# Patient Record
Sex: Female | Born: 1970 | Race: Black or African American | Hispanic: No | Marital: Married | Smoking: Never smoker
Health system: Southern US, Community
[De-identification: ages and names within clinical notes are randomized; demographics above are authoritative.]

## PROBLEM LIST (undated history)

## (undated) DIAGNOSIS — Z973 Presence of spectacles and contact lenses: Secondary | ICD-10-CM

## (undated) DIAGNOSIS — N6310 Unspecified lump in the right breast, unspecified quadrant: Secondary | ICD-10-CM

## (undated) DIAGNOSIS — Z889 Allergy status to unspecified drugs, medicaments and biological substances status: Secondary | ICD-10-CM

## (undated) HISTORY — PX: COSMETIC SURGERY: SHX468

## (undated) HISTORY — PX: INTRAUTERINE DEVICE INSERTION: SHX323

---

## 2005-09-25 ENCOUNTER — Emergency Department (HOSPITAL_COMMUNITY): Admission: EM | Admit: 2005-09-25 | Discharge: 2005-09-26 | Payer: Self-pay | Admitting: Emergency Medicine

## 2007-11-25 ENCOUNTER — Encounter: Admission: RE | Admit: 2007-11-25 | Discharge: 2007-11-25 | Payer: Self-pay | Admitting: Obstetrics and Gynecology

## 2007-11-25 IMAGING — MG MM DIAGNOSTIC BILATERAL
4 series · 4 of 4 positions shown · non-contrast
Comparison: None

CLINICAL DATA: The patient is experiencing tenderness across the
upper aspect of the right breast.  Upon compression, she notices a
milky discharge.

DIGITAL DIAGNOSTIC BILATERAL MAMMOGRAM WITH CAD

[R CC]
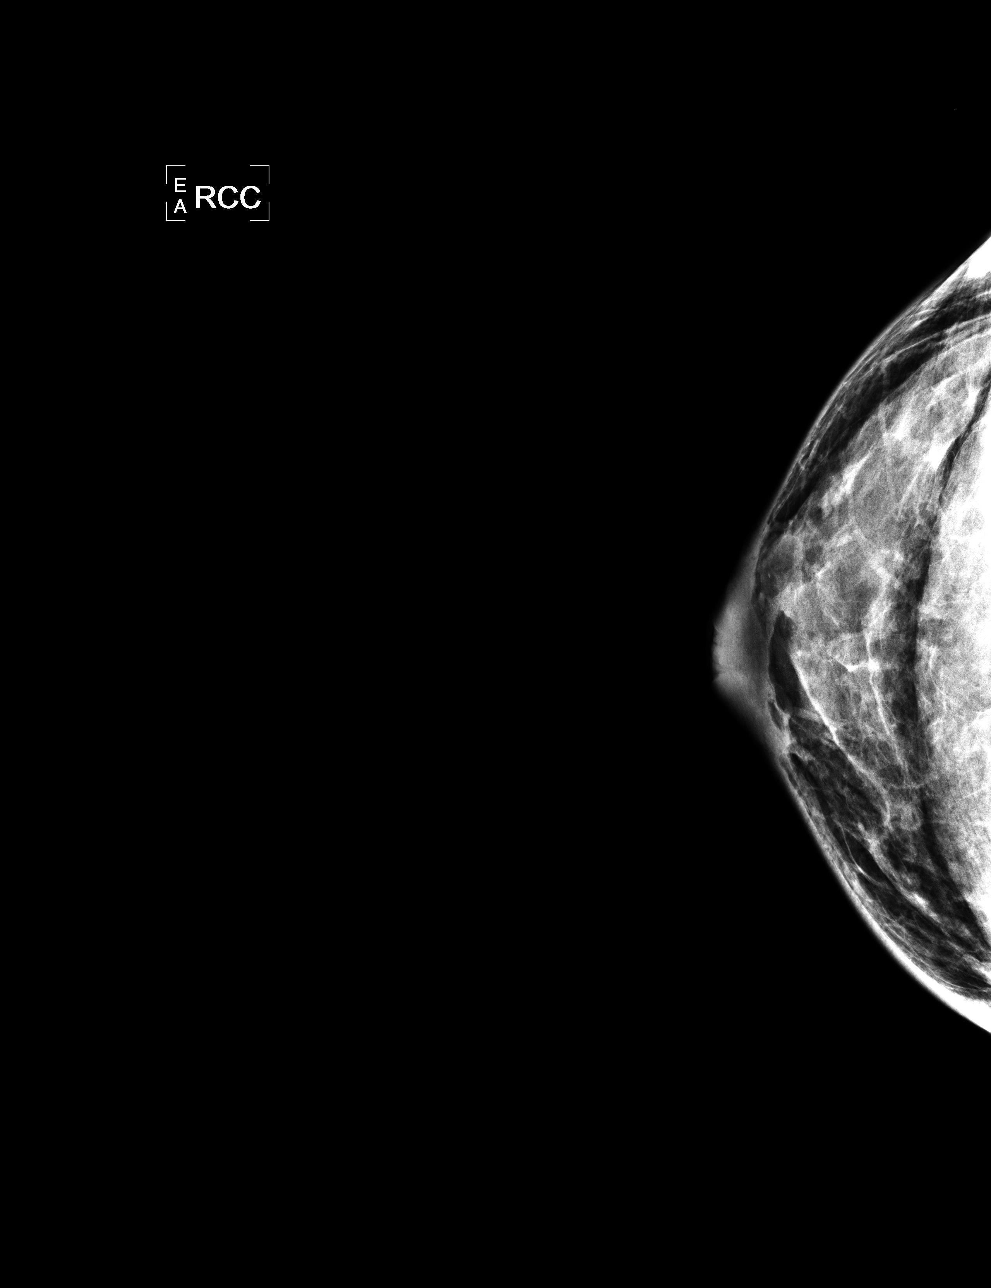

[L CC]
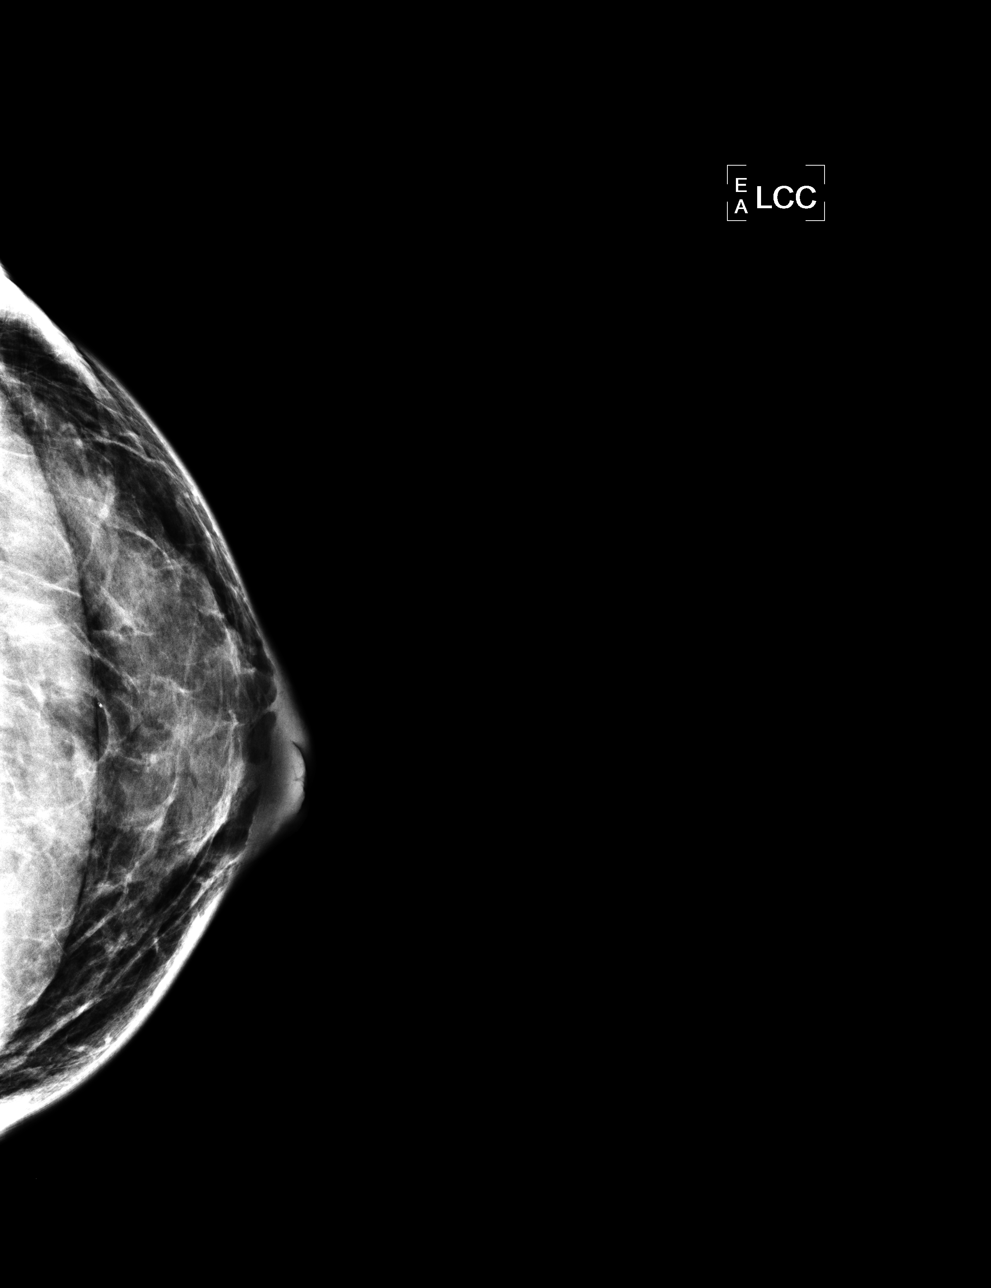

[L MLO]
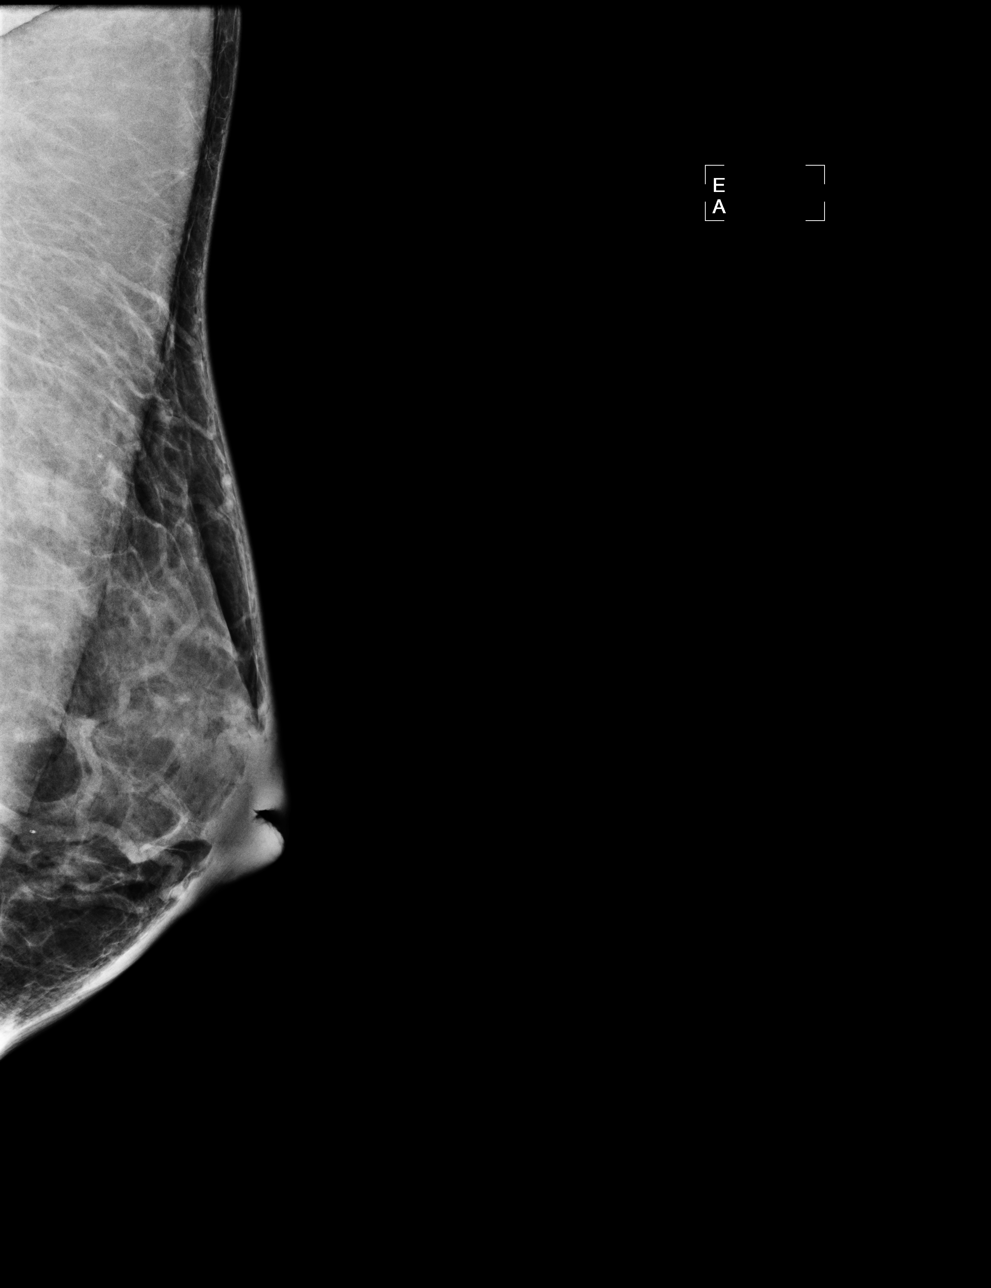

[R MLO]
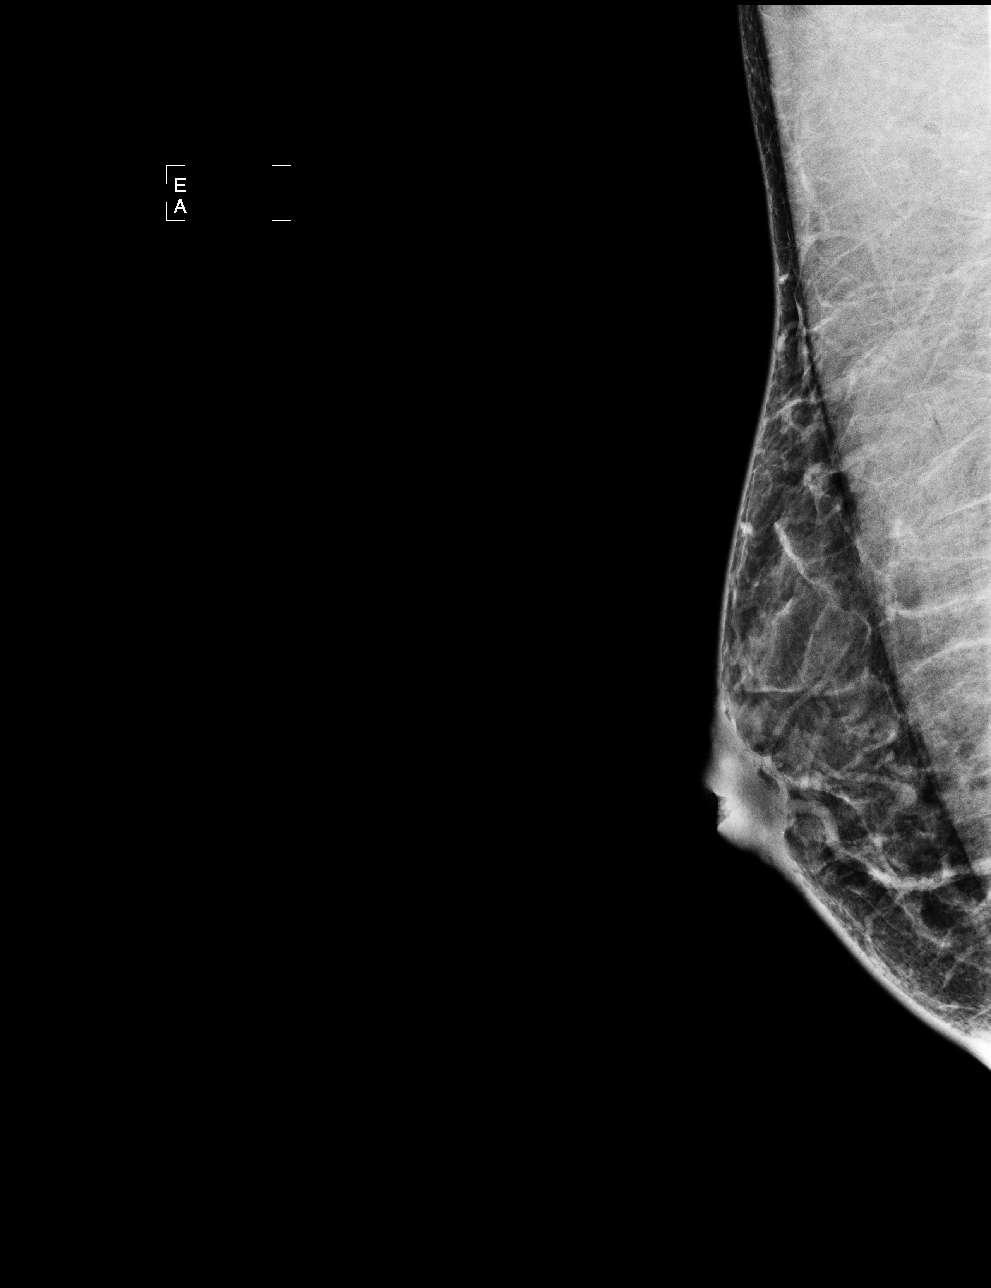

[4 of 4 positions shown; findings below may reference images not displayed]

FINDINGS: The breast tissue is heterogeneously dense.  There is no
dominant mass, architectural distortion or calcification to suggest
malignancy.  A milky discharge is not the type of discharge of
concern for carcinoma.  One may wish to consider prolactin levels.
IMPRESSION: No mammographic evidence of malignancy.  Yearly screening
mammography should begin at age 40 unless clinically indicated
earlier.

BI-RADS CATEGORY 1:  Negative.

## 2010-06-09 ENCOUNTER — Other Ambulatory Visit: Payer: Self-pay | Admitting: Obstetrics and Gynecology

## 2010-06-09 DIAGNOSIS — Z1231 Encounter for screening mammogram for malignant neoplasm of breast: Secondary | ICD-10-CM

## 2010-08-01 ENCOUNTER — Ambulatory Visit
Admission: RE | Admit: 2010-08-01 | Discharge: 2010-08-01 | Disposition: A | Discharge status: Home / Self Care | Payer: Managed Care, Other (non HMO) | Source: Ambulatory Visit | Attending: Obstetrics and Gynecology | Admitting: Obstetrics and Gynecology

## 2010-08-01 DIAGNOSIS — Z1231 Encounter for screening mammogram for malignant neoplasm of breast: Secondary | ICD-10-CM

## 2010-08-01 IMAGING — MG MM DIGITAL SCREENING {BCG}
4 series · 4 of 4 positions shown · non-contrast
Comparison: none

[R CC]
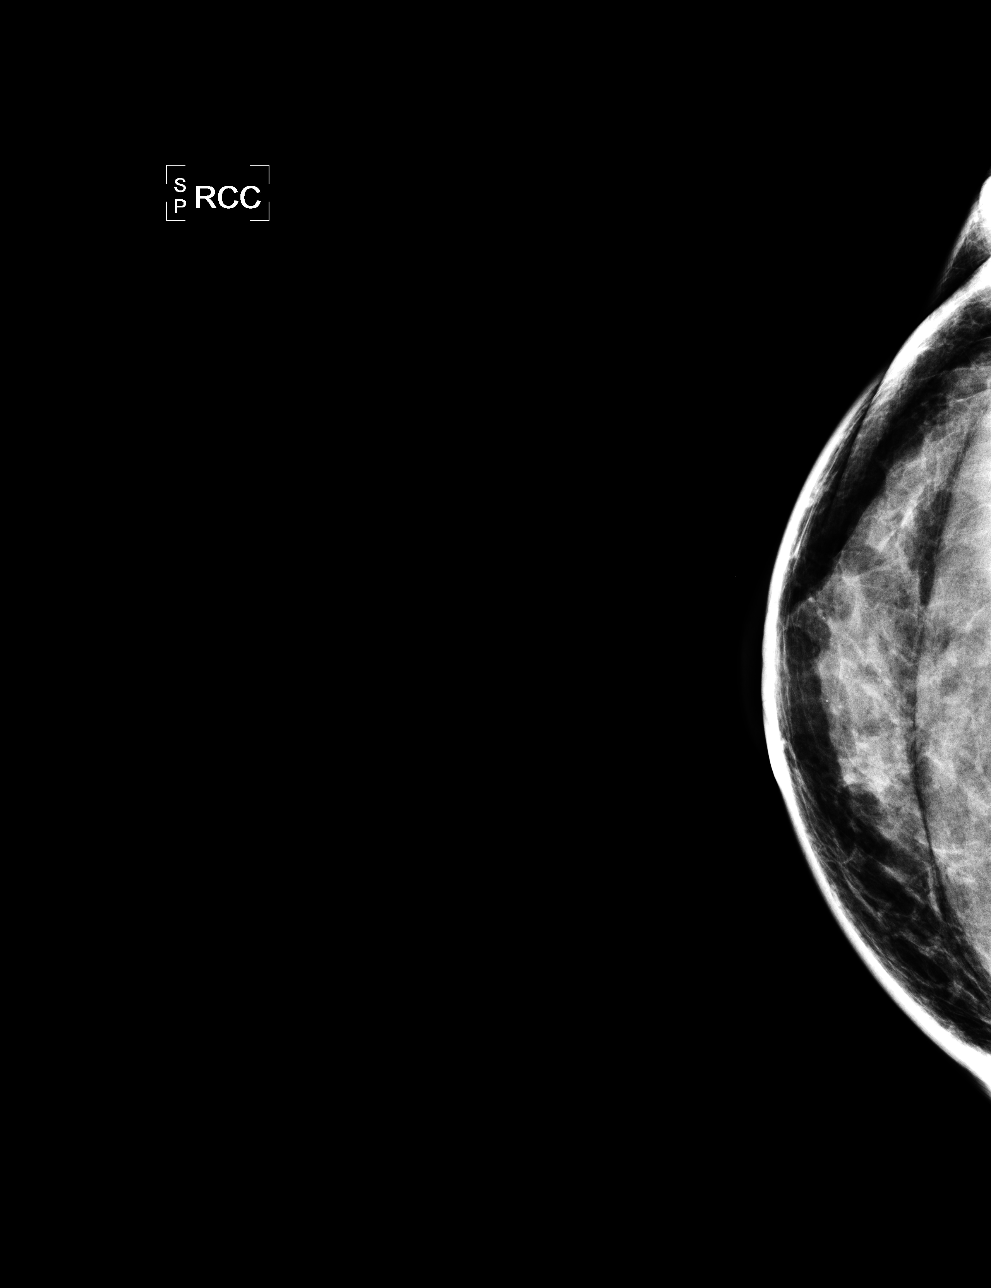

[L CC]
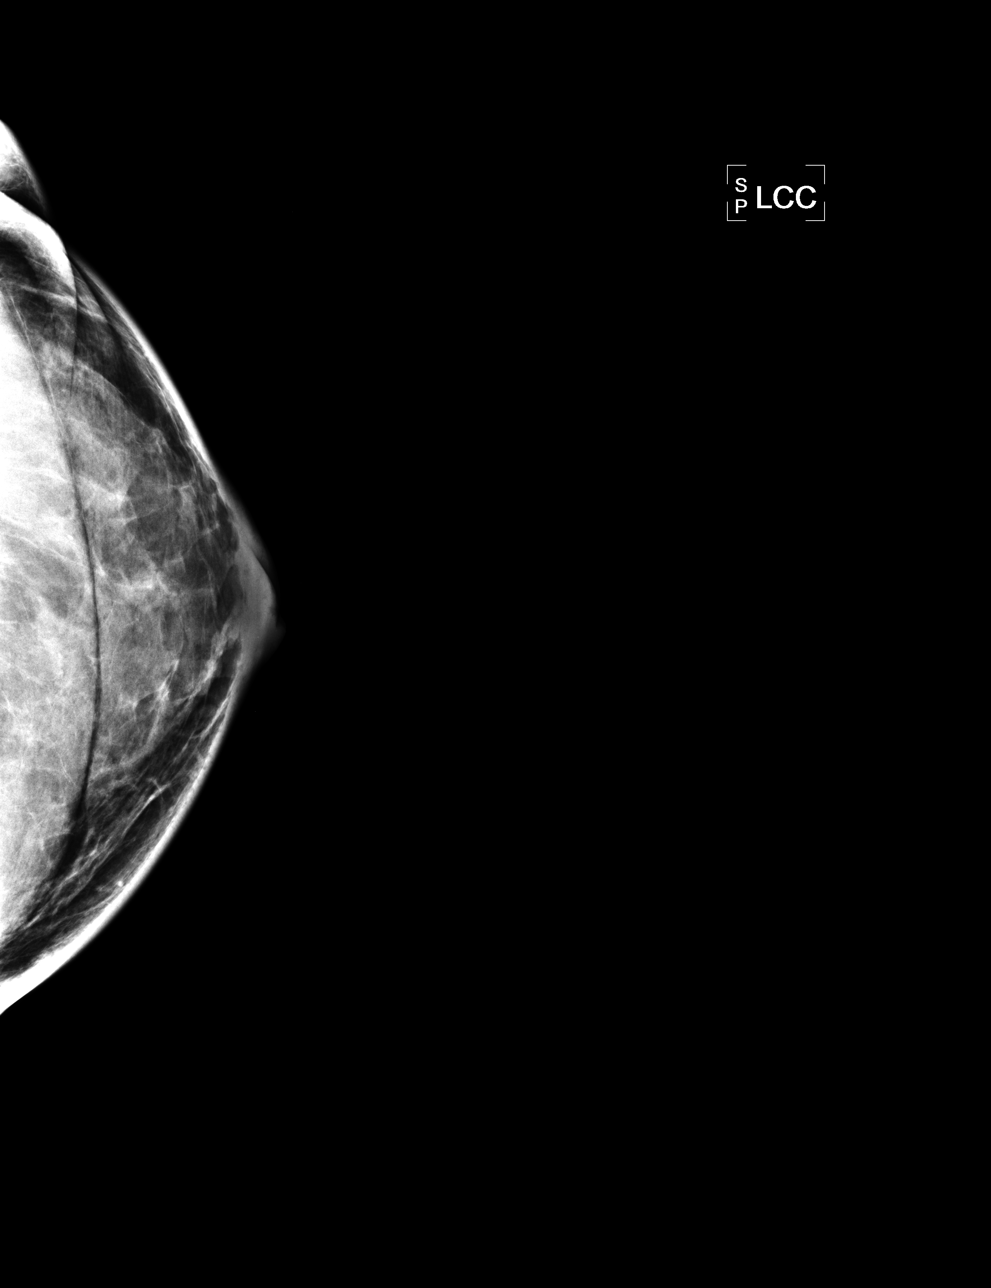

[L MLO]
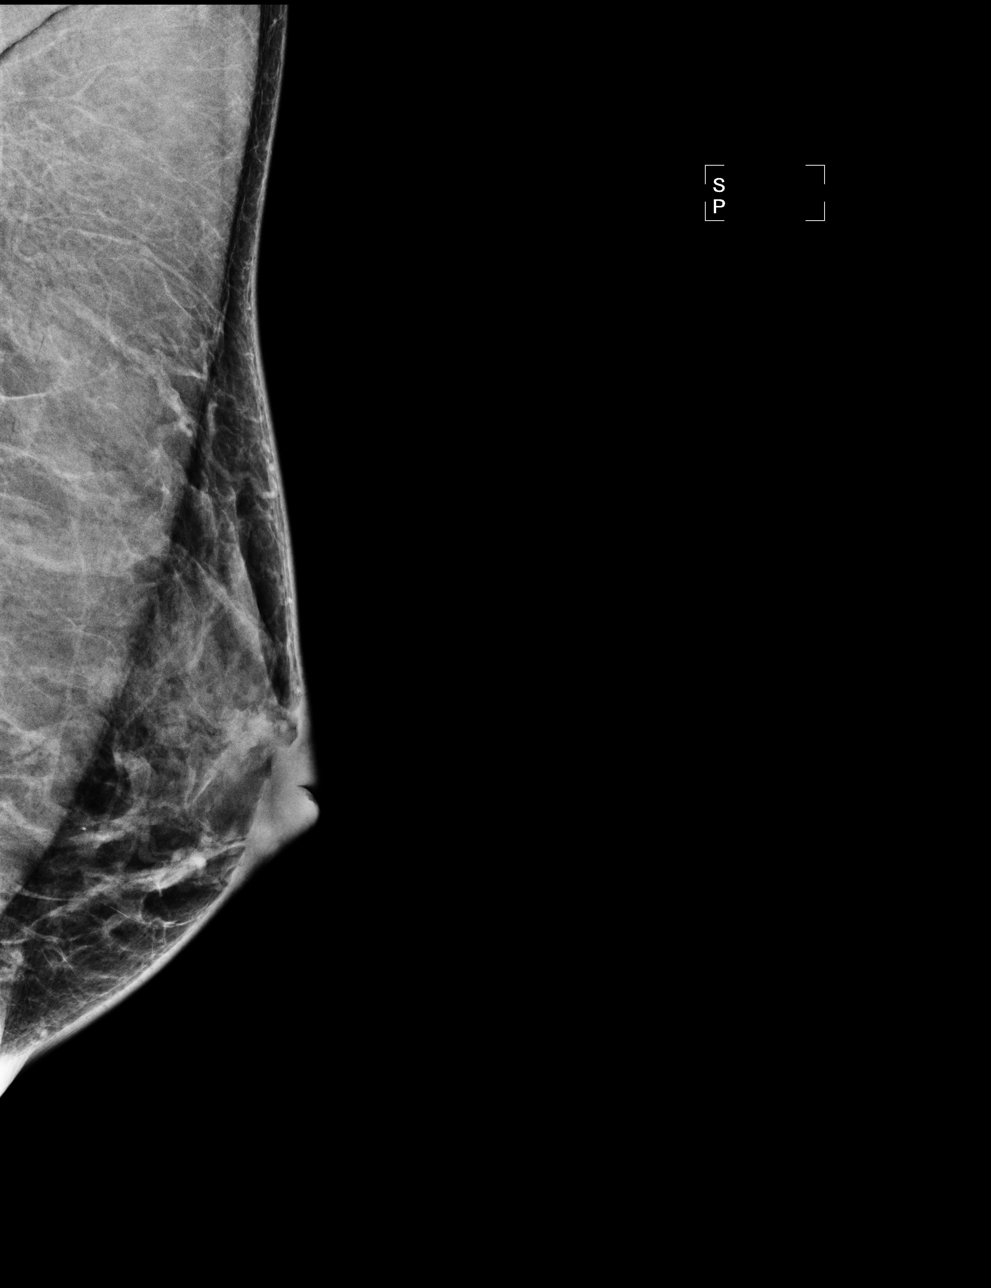

[R MLO]
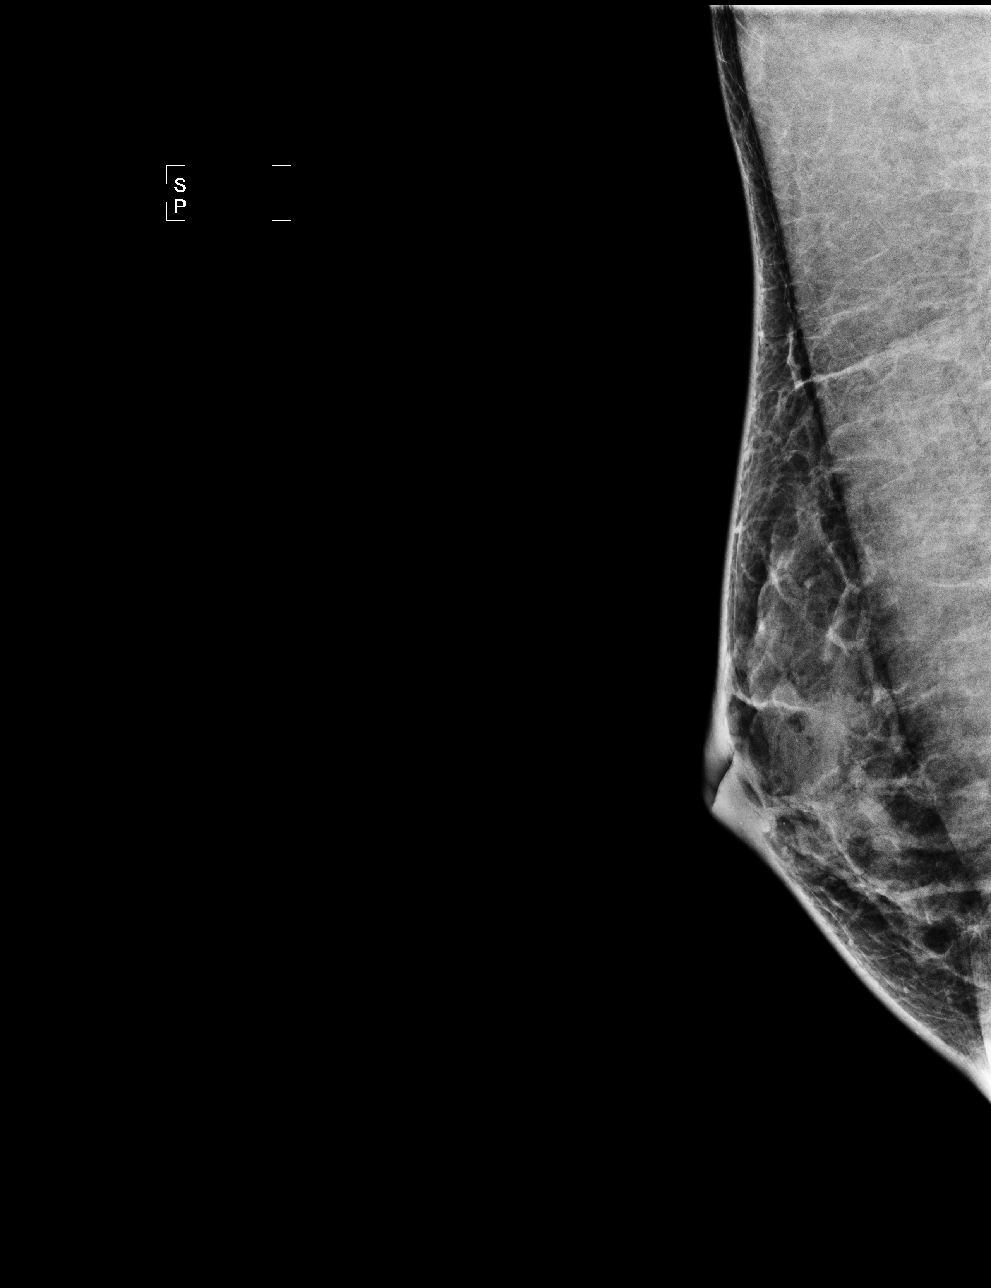

[4 of 4 positions shown; findings below may reference images not displayed]

Canned report from images found in remote index.

Refer to host system for actual result text.

## 2011-05-05 HISTORY — PX: PLACEMENT OF BREAST IMPLANTS: SHX6334

## 2011-07-06 ENCOUNTER — Other Ambulatory Visit: Payer: Self-pay | Admitting: Obstetrics and Gynecology

## 2011-07-06 DIAGNOSIS — Z1231 Encounter for screening mammogram for malignant neoplasm of breast: Secondary | ICD-10-CM

## 2011-07-24 ENCOUNTER — Ambulatory Visit: Payer: Managed Care, Other (non HMO)

## 2011-07-27 ENCOUNTER — Ambulatory Visit
Admission: RE | Admit: 2011-07-27 | Discharge: 2011-07-27 | Disposition: A | Discharge status: Home / Self Care | Payer: PRIVATE HEALTH INSURANCE | Source: Ambulatory Visit | Attending: Obstetrics and Gynecology | Admitting: Obstetrics and Gynecology

## 2011-07-27 DIAGNOSIS — Z1231 Encounter for screening mammogram for malignant neoplasm of breast: Secondary | ICD-10-CM

## 2011-08-03 ENCOUNTER — Ambulatory Visit: Payer: Managed Care, Other (non HMO)

## 2012-08-16 ENCOUNTER — Other Ambulatory Visit: Payer: Self-pay

## 2012-08-16 DIAGNOSIS — Z1231 Encounter for screening mammogram for malignant neoplasm of breast: Secondary | ICD-10-CM

## 2012-09-09 ENCOUNTER — Ambulatory Visit
Admission: RE | Admit: 2012-09-09 | Discharge: 2012-09-09 | Disposition: A | Discharge status: Home / Self Care | Payer: PRIVATE HEALTH INSURANCE | Source: Ambulatory Visit

## 2012-09-09 ENCOUNTER — Other Ambulatory Visit: Payer: Self-pay

## 2012-09-09 DIAGNOSIS — Z1231 Encounter for screening mammogram for malignant neoplasm of breast: Secondary | ICD-10-CM

## 2012-09-09 IMAGING — MG MM DIGITAL SCREENING W/ IMPLANTS
8 series · 8 of 8 positions shown · non-contrast
Comparison: Previous exams.

CLINICAL DATA: Screening.

DIGITAL SCREENING MAMMOGRAM WITH IMPLANTS AND CAD
The patient has  implants. Standard and implant displaced views
were performed.

[R CC]
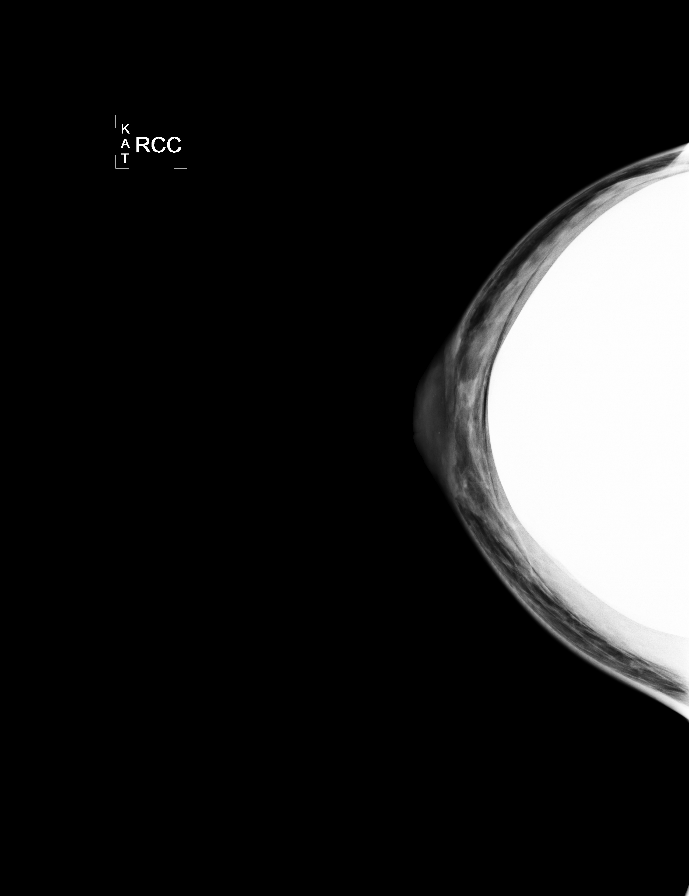

[L CC]
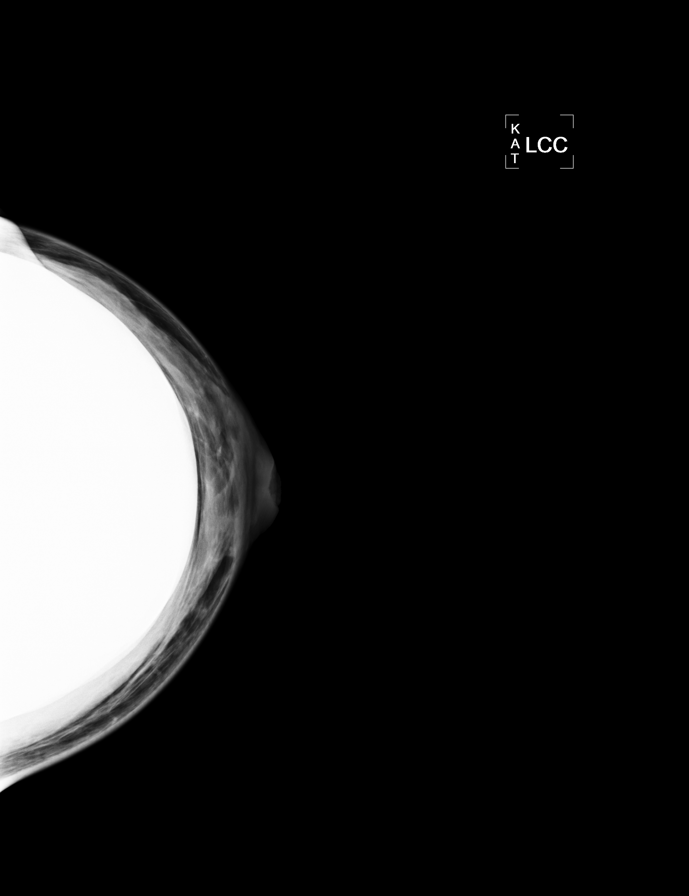

[L MLO]
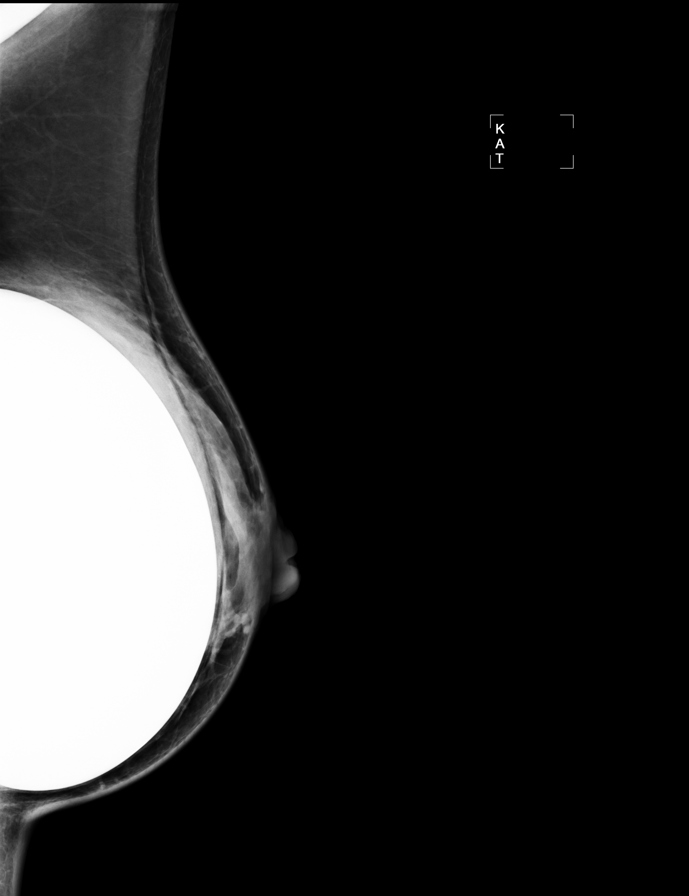

[R MLO]
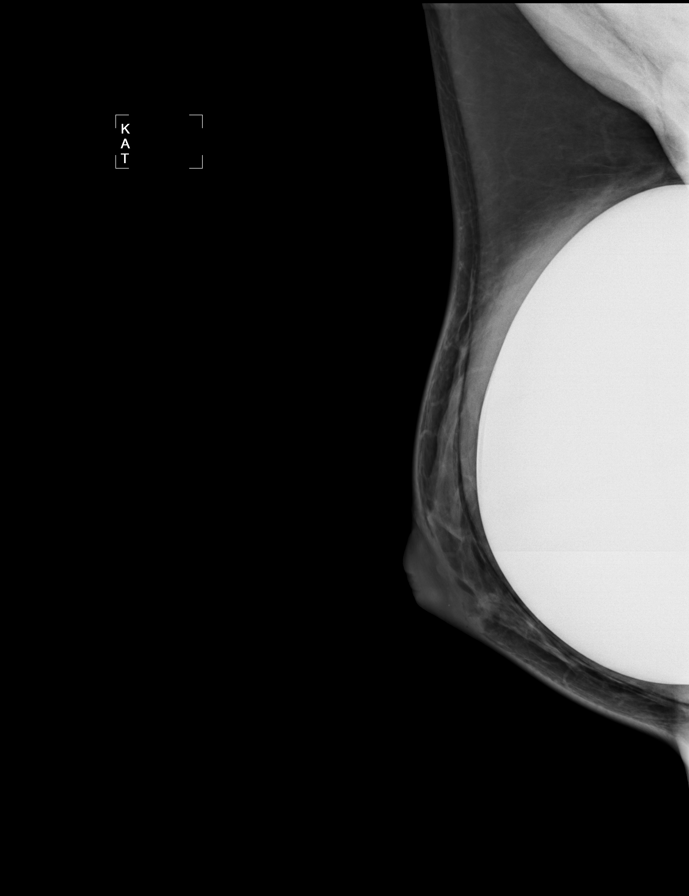

[R CCID]
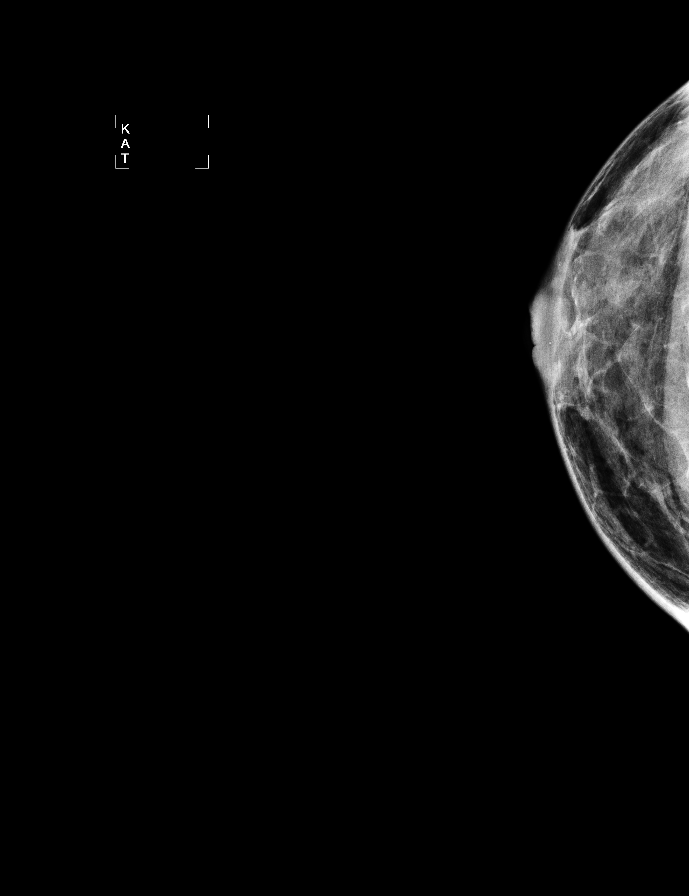

[L CCID]
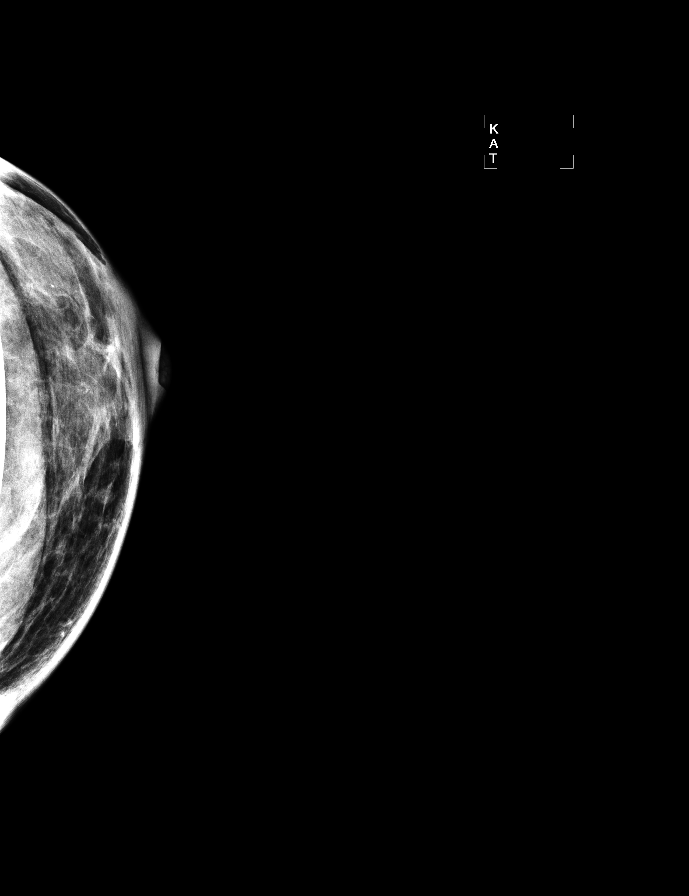

[L MLOID]
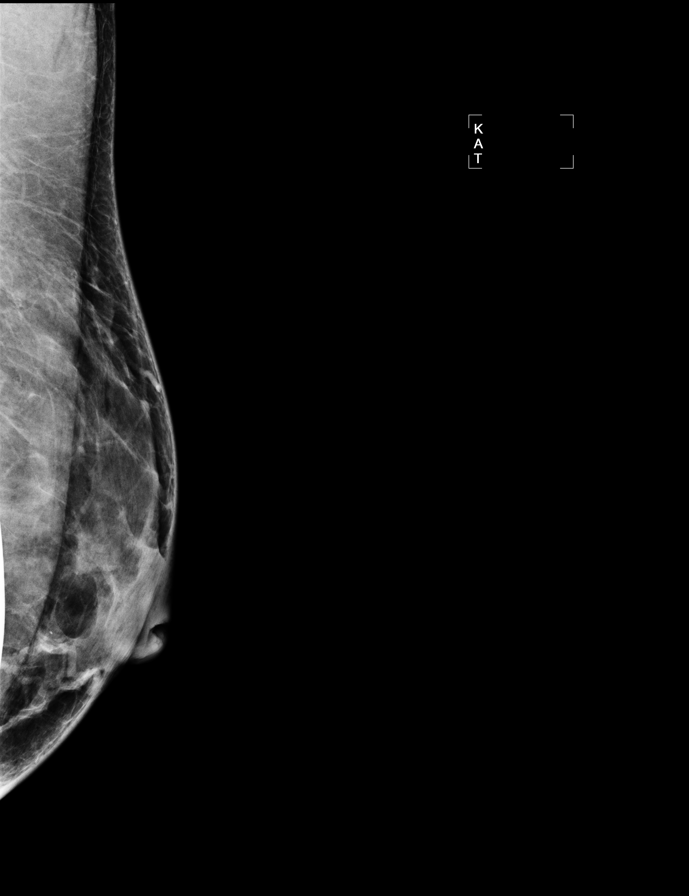

[R MLOID]
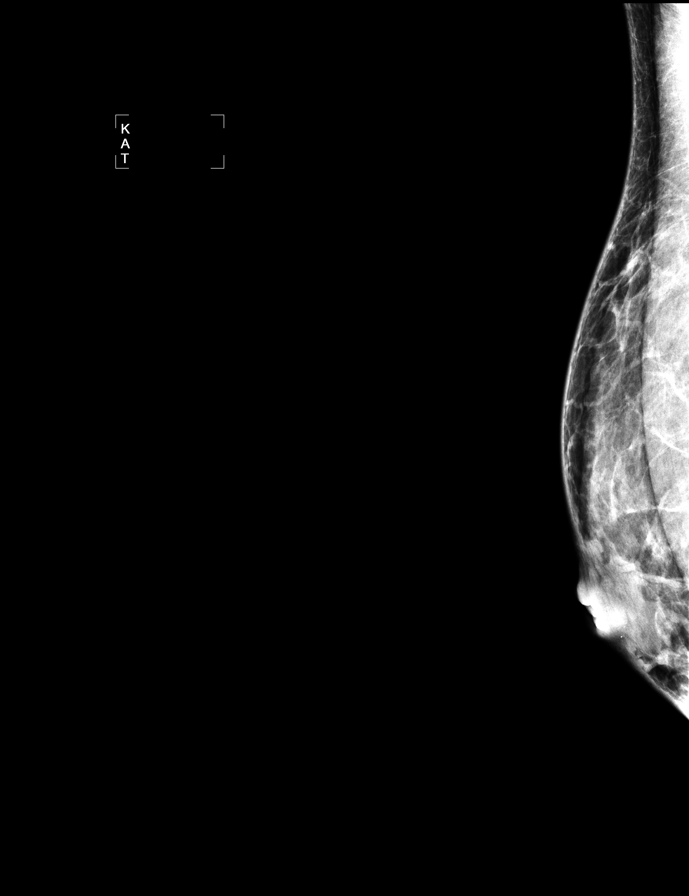

[8 of 8 positions shown; findings below may reference images not displayed]

FINDINGS: ACR Breast Density Category 3: The breast tissue is heterogeneously
dense.

No suspicious masses, architectural distortion, or calcifications
are present.

Images were processed with CAD.
IMPRESSION: No mammographic evidence of malignancy.

A result letter of this screening mammogram will be mailed directly
to the patient.

RECOMMENDATION:
Screening mammogram in one year. (Code:[T8])

BI-RADS CATEGORY 1:  Negative.

## 2013-05-23 ENCOUNTER — Ambulatory Visit: Payer: Self-pay | Admitting: Psychology

## 2014-04-09 ENCOUNTER — Other Ambulatory Visit (INDEPENDENT_AMBULATORY_CARE_PROVIDER_SITE_OTHER): Payer: Self-pay | Admitting: General Surgery

## 2014-04-09 DIAGNOSIS — N6320 Unspecified lump in the left breast, unspecified quadrant: Secondary | ICD-10-CM

## 2014-04-20 ENCOUNTER — Encounter (HOSPITAL_BASED_OUTPATIENT_CLINIC_OR_DEPARTMENT_OTHER): Payer: Self-pay | Admitting: *Deleted

## 2014-04-20 NOTE — Progress Notes (Signed)
Pt having seed done early am surgery-will go home then come back Ill come in for labs

## 2014-04-24 ENCOUNTER — Ambulatory Visit (HOSPITAL_BASED_OUTPATIENT_CLINIC_OR_DEPARTMENT_OTHER)
Admission: RE | Admit: 2014-04-24 | Discharge: 2014-04-24 | Disposition: A | Discharge status: Home / Self Care | Payer: PRIVATE HEALTH INSURANCE | Source: Ambulatory Visit | Attending: General Surgery | Admitting: General Surgery

## 2014-04-24 ENCOUNTER — Encounter (HOSPITAL_BASED_OUTPATIENT_CLINIC_OR_DEPARTMENT_OTHER)
Admission: RE | Disposition: A | Discharge status: Home / Self Care | Payer: Self-pay | Source: Ambulatory Visit | Attending: General Surgery

## 2014-04-24 ENCOUNTER — Encounter (HOSPITAL_BASED_OUTPATIENT_CLINIC_OR_DEPARTMENT_OTHER): Payer: Self-pay | Admitting: Anesthesiology

## 2014-04-24 ENCOUNTER — Ambulatory Visit (HOSPITAL_BASED_OUTPATIENT_CLINIC_OR_DEPARTMENT_OTHER): Payer: PRIVATE HEALTH INSURANCE | Admitting: Anesthesiology

## 2014-04-24 DIAGNOSIS — N63 Unspecified lump in breast: Secondary | ICD-10-CM | POA: Diagnosis not present

## 2014-04-24 DIAGNOSIS — N6092 Unspecified benign mammary dysplasia of left breast: Secondary | ICD-10-CM | POA: Diagnosis not present

## 2014-04-24 DIAGNOSIS — Z91018 Allergy to other foods: Secondary | ICD-10-CM | POA: Insufficient documentation

## 2014-04-24 DIAGNOSIS — R928 Other abnormal and inconclusive findings on diagnostic imaging of breast: Secondary | ICD-10-CM | POA: Diagnosis present

## 2014-04-24 HISTORY — PX: RADIOACTIVE SEED GUIDED EXCISIONAL BREAST BIOPSY: SHX6490

## 2014-04-24 HISTORY — DX: Presence of spectacles and contact lenses: Z97.3

## 2014-04-24 HISTORY — DX: Allergy status to unspecified drugs, medicaments and biological substances: Z88.9

## 2014-04-24 LAB — POCT I-STAT, CHEM 8
BUN: 12 mg/dL (ref 6–23)
CALCIUM ION: 1.2 mmol/L (ref 1.12–1.23)
CHLORIDE: 104 meq/L (ref 96–112)
Creatinine, Ser: 0.9 mg/dL (ref 0.50–1.10)
Glucose, Bld: 77 mg/dL (ref 70–99)
HEMATOCRIT: 44 % (ref 36.0–46.0)
HEMOGLOBIN: 15 g/dL (ref 12.0–15.0)
Potassium: 4 mmol/L (ref 3.5–5.1)
Sodium: 139 mmol/L (ref 135–145)
TCO2: 23 mmol/L (ref 0–100)

## 2014-04-24 SURGERY — RADIOACTIVE SEED GUIDED BREAST BIOPSY
Anesthesia: General | Site: Breast | Laterality: Left

## 2014-04-24 MED ORDER — BUPIVACAINE HCL (PF) 0.25 % IJ SOLN
INTRAMUSCULAR | Status: AC
  Filled 2014-04-24: qty 60

## 2014-04-24 MED ORDER — DEXAMETHASONE SODIUM PHOSPHATE 4 MG/ML IJ SOLN
INTRAMUSCULAR | Status: DC | PRN
  Administered 2014-04-24: 10 mg via INTRAVENOUS

## 2014-04-24 MED ORDER — BUPIVACAINE HCL (PF) 0.25 % IJ SOLN
INTRAMUSCULAR | Status: DC | PRN
  Administered 2014-04-24: 10 mL

## 2014-04-24 MED ORDER — PROPOFOL 10 MG/ML IV BOLUS
INTRAVENOUS | Status: DC | PRN
  Administered 2014-04-24: 200 mg via INTRAVENOUS

## 2014-04-24 MED ORDER — FENTANYL CITRATE 0.05 MG/ML IJ SOLN
INTRAMUSCULAR | Status: DC | PRN
  Administered 2014-04-24: 100 ug via INTRAVENOUS

## 2014-04-24 MED ORDER — FENTANYL CITRATE 0.05 MG/ML IJ SOLN: 50.0000 ug | INTRAMUSCULAR | Status: DC | PRN

## 2014-04-24 MED ORDER — LACTATED RINGERS IV SOLN
INTRAVENOUS | Status: DC
  Administered 2014-04-24 (×2): via INTRAVENOUS

## 2014-04-24 MED ORDER — OXYCODONE-ACETAMINOPHEN 10-325 MG PO TABS: 1.0000 | ORAL_TABLET | Freq: Four times a day (QID) | ORAL | Status: AC | PRN

## 2014-04-24 MED ORDER — MIDAZOLAM HCL 2 MG/2ML IJ SOLN: 1.0000 mg | INTRAMUSCULAR | Status: DC | PRN

## 2014-04-24 MED ORDER — ONDANSETRON HCL 4 MG/2ML IJ SOLN
INTRAMUSCULAR | Status: DC | PRN
  Administered 2014-04-24: 4 mg via INTRAVENOUS

## 2014-04-24 MED ORDER — CEFAZOLIN SODIUM-DEXTROSE 2-3 GM-% IV SOLR: 2.0000 g | INTRAVENOUS | Status: DC

## 2014-04-24 MED ORDER — MIDAZOLAM HCL 5 MG/5ML IJ SOLN
INTRAMUSCULAR | Status: DC | PRN
  Administered 2014-04-24: 2 mg via INTRAVENOUS

## 2014-04-24 MED ORDER — LIDOCAINE HCL (CARDIAC) 20 MG/ML IV SOLN
INTRAVENOUS | Status: DC | PRN
  Administered 2014-04-24: 100 mg via INTRAVENOUS

## 2014-04-24 MED ORDER — FENTANYL CITRATE 0.05 MG/ML IJ SOLN
INTRAMUSCULAR | Status: AC
  Filled 2014-04-24: qty 4

## 2014-04-24 MED ORDER — MIDAZOLAM HCL 2 MG/2ML IJ SOLN
INTRAMUSCULAR | Status: AC
  Filled 2014-04-24: qty 2

## 2014-04-24 SURGICAL SUPPLY — 55 items
APPLIER CLIP 9.375 MED OPEN (MISCELLANEOUS)
BENZOIN TINCTURE PRP APPL 2/3 (GAUZE/BANDAGES/DRESSINGS) IMPLANT
BINDER BREAST LRG (GAUZE/BANDAGES/DRESSINGS) IMPLANT
BINDER BREAST MEDIUM (GAUZE/BANDAGES/DRESSINGS) ×2 IMPLANT
BINDER BREAST XLRG (GAUZE/BANDAGES/DRESSINGS) IMPLANT
BINDER BREAST XXLRG (GAUZE/BANDAGES/DRESSINGS) IMPLANT
BLADE SURG 15 STRL LF DISP TIS (BLADE) ×1 IMPLANT
BLADE SURG 15 STRL SS (BLADE) ×1
CANISTER SUC SOCK COL 7IN (MISCELLANEOUS) IMPLANT
CANISTER SUCT 1200ML W/VALVE (MISCELLANEOUS) IMPLANT
CHLORAPREP W/TINT 26ML (MISCELLANEOUS) ×2 IMPLANT
CLIP APPLIE 9.375 MED OPEN (MISCELLANEOUS) IMPLANT
COVER BACK TABLE 60X90IN (DRAPES) ×2 IMPLANT
COVER MAYO STAND STRL (DRAPES) ×2 IMPLANT
COVER PROBE W GEL 5X96 (DRAPES) ×2 IMPLANT
DECANTER SPIKE VIAL GLASS SM (MISCELLANEOUS) IMPLANT
DEVICE DUBIN W/COMP PLATE 8390 (MISCELLANEOUS) ×2 IMPLANT
DRAPE PED LAPAROTOMY (DRAPES) ×2 IMPLANT
DRSG TEGADERM 4X4.75 (GAUZE/BANDAGES/DRESSINGS) IMPLANT
ELECT COATED BLADE 2.86 ST (ELECTRODE) ×2 IMPLANT
ELECT REM PT RETURN 9FT ADLT (ELECTROSURGICAL) ×2
ELECTRODE REM PT RTRN 9FT ADLT (ELECTROSURGICAL) ×1 IMPLANT
GLOVE BIO SURGEON STRL SZ7 (GLOVE) ×6 IMPLANT
GLOVE BIOGEL PI IND STRL 7.5 (GLOVE) ×1 IMPLANT
GLOVE BIOGEL PI INDICATOR 7.5 (GLOVE) ×1
GLOVE SURG SS PI 7.0 STRL IVOR (GLOVE) ×2 IMPLANT
GOWN STRL REUS W/ TWL LRG LVL3 (GOWN DISPOSABLE) ×3 IMPLANT
GOWN STRL REUS W/TWL LRG LVL3 (GOWN DISPOSABLE) ×3
GOWN STRL REUS W/TWL LRG LVL4 (GOWN DISPOSABLE) ×2 IMPLANT
KIT MARKER MARGIN INK (KITS) ×2 IMPLANT
LIQUID BAND (GAUZE/BANDAGES/DRESSINGS) ×2 IMPLANT
MARKER SKIN DUAL TIP RULER LAB (MISCELLANEOUS) ×2 IMPLANT
NEEDLE HYPO 25X1 1.5 SAFETY (NEEDLE) ×2 IMPLANT
NS IRRIG 1000ML POUR BTL (IV SOLUTION) IMPLANT
PACK BASIN DAY SURGERY FS (CUSTOM PROCEDURE TRAY) ×2 IMPLANT
PENCIL BUTTON HOLSTER BLD 10FT (ELECTRODE) ×2 IMPLANT
SHEET MEDIUM DRAPE 40X70 STRL (DRAPES) IMPLANT
SLEEVE SCD COMPRESS KNEE MED (MISCELLANEOUS) ×2 IMPLANT
SPONGE GAUZE 4X4 12PLY STER LF (GAUZE/BANDAGES/DRESSINGS) IMPLANT
SPONGE LAP 4X18 X RAY DECT (DISPOSABLE) ×2 IMPLANT
STAPLER VISISTAT 35W (STAPLE) IMPLANT
STRIP CLOSURE SKIN 1/2X4 (GAUZE/BANDAGES/DRESSINGS) ×2 IMPLANT
SUT MNCRL AB 4-0 PS2 18 (SUTURE) IMPLANT
SUT MON AB 5-0 PS2 18 (SUTURE) IMPLANT
SUT SILK 2 0 SH (SUTURE) IMPLANT
SUT VIC AB 2-0 SH 27 (SUTURE) ×2
SUT VIC AB 2-0 SH 27XBRD (SUTURE) ×1 IMPLANT
SUT VIC AB 3-0 SH 27 (SUTURE) ×1
SUT VIC AB 3-0 SH 27X BRD (SUTURE) ×1 IMPLANT
SUT VIC AB 5-0 PS2 18 (SUTURE) IMPLANT
SYR CONTROL 10ML LL (SYRINGE) ×2 IMPLANT
TOWEL OR 17X24 6PK STRL BLUE (TOWEL DISPOSABLE) ×2 IMPLANT
TOWEL OR NON WOVEN STRL DISP B (DISPOSABLE) ×2 IMPLANT
TUBE CONNECTING 20X1/4 (TUBING) IMPLANT
YANKAUER SUCT BULB TIP NO VENT (SUCTIONS) IMPLANT

## 2014-04-24 NOTE — H&P (Signed)
43 yof who presents with a self palpated left breast mass in October. She has retromuscular breast implants. She has no nipple discharge. she has history of mastitis but no other breast history. She has no FH. She underwent mm that showed breast density category D. She had no mm abnormality. On ultrasound she had a 6 mm oval mass in the left breast that was read as probably benign. This was followed up recently and still present. She was referred for consideration of excision.   Other Problems  Back Pain  Past Surgical History  Breast Augmentation Bilateral. Cesarean Section - 1 Oral Surgery  Diagnostic Studies History  Colonoscopy never Mammogram within last year Pap Smear 1-5 years ago  Allergies  No Known Drug Allergies  Social History Alcohol use Occasional alcohol use. Caffeine use Carbonated beverages, Coffee, Tea. No drug use Tobacco use Never smoker.  Family History  Colon Polyps Father. Diabetes Mellitus Father. Heart disease in female family member before age 23 Heart disease in female family member before age 50 Hypertension Father, Sister.  Pregnancy / Birth History  Age at menarche 43 years. Contraceptive History Depo-provera, Intrauterine device, Oral contraceptives. Gravida 2 Irregular periods Maternal age 43-43 Para 2  Review of Systems  General Not Present- Appetite Loss, Chills, Fatigue, Fever, Night Sweats, Weight Gain and Weight Loss. Skin Not Present- Change in Wart/Mole, Dryness, Hives, Jaundice, New Lesions, Non-Healing Wounds, Rash and Ulcer. HEENT Present- Seasonal Allergies and Wears glasses/contact lenses. Not Present- Earache, Hearing Loss, Hoarseness, Nose Bleed, Oral Ulcers, Ringing in the Ears, Sinus Pain, Sore Throat, Visual Disturbances and Yellow Eyes. Respiratory Not Present- Bloody sputum, Chronic Cough, Difficulty Breathing, Snoring and Wheezing. Breast Present- Breast Mass and Breast Pain. Not Present-  Nipple Discharge and Skin Changes. Cardiovascular Not Present- Chest Pain, Difficulty Breathing Lying Down, Leg Cramps, Palpitations, Rapid Heart Rate, Shortness of Breath and Swelling of Extremities. Gastrointestinal Not Present- Abdominal Pain, Bloating, Bloody Stool, Change in Bowel Habits, Chronic diarrhea, Constipation, Difficulty Swallowing, Excessive gas, Gets full quickly at meals, Hemorrhoids, Indigestion, Nausea, Rectal Pain and Vomiting. Female Genitourinary Not Present- Frequency, Nocturia, Painful Urination, Pelvic Pain and Urgency. Musculoskeletal Present- Back Pain. Not Present- Joint Pain, Joint Stiffness, Muscle Pain, Muscle Weakness and Swelling of Extremities. Neurological Not Present- Decreased Memory, Fainting, Headaches, Numbness, Seizures, Tingling, Tremor, Trouble walking and Weakness. Psychiatric Not Present- Anxiety, Bipolar, Change in Sleep Pattern, Depression, Fearful and Frequent crying. Endocrine Not Present- Cold Intolerance, Excessive Hunger, Hair Changes, Heat Intolerance, Hot flashes and New Diabetes. Hematology Not Present- Easy Bruising, Excessive bleeding, Gland problems, HIV and Persistent Infections.   Vitals  Weight: 139.6 lb Height: 64.5in Body Surface Area: 1.7 m Body Mass Index: 23.59 kg/m Temp.: 98.25F(Temporal)  Pulse: 66 (Regular)  Resp.: 16 (Unlabored)  BP: 114/70 (Sitting, Left Arm, Standard)    Physical Exam  General Mental Status-Alert. Orientation-Oriented X3.  Chest and Lung Exam Chest and lung exam reveals -normal excursion with symmetric chest walls, quiet, even and easy respiratory effort with no use of accessory muscles and on auscultation, normal breath sounds, no adventitious sounds and normal vocal resonance.  Breast Nipples Discharge - Bilateral - None. Breast - Bilateral-Normal. Breast Lump-No Palpable Breast Mass. Note: implants in place, bilateral IM scars   Cardiovascular Cardiovascular  examination reveals -normal heart sounds, regular rate and rhythm with no murmurs.  Lymphatic Head & Neck  General Head & Neck Lymphatics: Bilateral - Description - Normal. Axillary  General Axillary Region: Bilateral - Description - Normal. Note: no Bandera  adenopathy     Assessment & Plan  LEFT BREAST MASS (611.72  N63) Impression: I have low index of suspicion that this is a malignancy but as we cannot core due to radiology concern for implants we discussed left breast radioactive seed guided excisional biopsy to rule out malignancy. risks including implant rupture were discussed.

## 2014-04-24 NOTE — Transfer of Care (Signed)
Immediate Anesthesia Transfer of Care Note  Patient: Lori Clay  Procedure(s) Performed: Procedure(s): RADIOACTIVE SEED GUIDED EXCISIONAL BREAST BIOPSY (Left)  Patient Location: PACU  Anesthesia Type:General  Level of Consciousness: awake, alert  and oriented  Airway & Oxygen Therapy: Patient Spontanous Breathing and Patient connected to nasal cannula oxygen  Post-op Assessment: Report given to PACU RN and Post -op Vital signs reviewed and stable  Post vital signs: Reviewed and stable  Complications: No apparent anesthesia complications

## 2014-04-24 NOTE — Op Note (Signed)
Preoperative diagnosis: abnormal mammogram, unable to do core biopsy Postoperative diagnosis: same as above Procedure: Left breast radioactive seed guided excisional biopsy Surgeon: Dr Serita Grammes EBL: minimal Drains: none Specimens: left breast marked with paint Complications: None Anesthesia: Gen. Sponge and needle count was correct at completion Disposition to recovery stable  Indications: This is a 35 old female who has bilateral implants and presents with an abnormal mammogram on the left side. Due to the implants this is unable to undergo a core biopsy. She was then referred for evaluation for excisional biopsy. She had a radioactive seed placed prior to beginning.  Procedure: After informed consent was obtained the patient was taken to the operating room. I had her mammograms in the operating room. She was then placed under anesthesia without complication. Her left breast was prepped and draped in the standard sterile surgical fashion. A surgical timeout was then performed.  I located the radioactive seed. I then made a curvilinear incision the upper outer quadrant. Cautery was used to remove the seed and the surrounding tissue. This was done with NeoProbe guidance. The seed was confirmed to be removed by the neoprobe. There was no background radioactivity in the breast. This was then marked with paint. Faxitron mammogram was taken, following removal of the seed. This was confirmed by radiology. Hemostasis was obtained. I then closed this with 3-0 Vicryl, 5-0 Monocryl, and glue. Steri-Strips are placed. A breast binder was placed. She tolerated this well and was transferred to recovery.

## 2014-04-24 NOTE — Anesthesia Preprocedure Evaluation (Signed)
Anesthesia Evaluation  Patient identified by MRN, date of birth, ID band Patient awake    Reviewed: Allergy & Precautions, H&P , NPO status , reviewed documented beta blocker date and time   Airway Mallampati: II       Dental  (+) Teeth Intact   Pulmonary neg pulmonary ROS,  breath sounds clear to auscultation        Cardiovascular Rhythm:Regular     Neuro/Psych    GI/Hepatic negative GI ROS, Neg liver ROS,   Endo/Other  negative endocrine ROS  Renal/GU      Musculoskeletal   Abdominal (+)  Abdomen: soft.    Peds  Hematology negative hematology ROS (+)   Anesthesia Other Findings   Reproductive/Obstetrics                             Anesthesia Physical Anesthesia Plan  ASA: I  Anesthesia Plan: General   Post-op Pain Management:    Induction: Intravenous  Airway Management Planned: LMA  Additional Equipment:   Intra-op Plan:   Post-operative Plan: Extubation in OR  Informed Consent: I have reviewed the patients History and Physical, chart, labs and discussed the procedure including the risks, benefits and alternatives for the proposed anesthesia with the patient or authorized representative who has indicated his/her understanding and acceptance.     Plan Discussed with:   Anesthesia Plan Comments:         Anesthesia Quick Evaluation

## 2014-04-24 NOTE — Anesthesia Postprocedure Evaluation (Signed)
  Anesthesia Post-op Note  Patient: Lori Clay  Procedure(s) Performed: Procedure(s): RADIOACTIVE SEED GUIDED EXCISIONAL BREAST BIOPSY (Left)  Patient Location: PACU  Anesthesia Type: General   Level of Consciousness: awake, alert  and oriented  Airway and Oxygen Therapy: Patient Spontanous Breathing  Post-op Pain: mild  Post-op Assessment: Post-op Vital signs reviewed  Post-op Vital Signs: Reviewed  Last Vitals:  Filed Vitals:   04/24/14 1600  BP: 133/64  Pulse: 65  Temp: 36.4 C  Resp: 18    Complications: No apparent anesthesia complications

## 2014-04-24 NOTE — Discharge Instructions (Signed)
[facility] °Office Phone Number [phone] ° °BREAST BIOPSY/ PARTIAL MASTECTOMY: POST OP INSTRUCTIONS ° °Always review your discharge instruction sheet given to you by the facility where your surgery was performed. ° °IF YOU HAVE DISABILITY OR FAMILY LEAVE FORMS, YOU MUST BRING THEM TO THE OFFICE FOR PROCESSING.  DO NOT GIVE THEM TO YOUR DOCTOR. ° °1. A prescription for pain medication may be given to you upon discharge.  Take your pain medication as prescribed, if needed.  If narcotic pain medicine is not needed, then you may take acetaminophen (Tylenol), naprosyn (Alleve) or ibuprofen (Advil) as needed. °2. Take your usually prescribed medications unless otherwise directed °3. If you need a refill on your pain medication, please contact your pharmacy.  They will contact our office to request authorization.  Prescriptions will not be filled after 5pm or on week-ends. °4. You should eat very light the first 24 hours after surgery, such as soup, crackers, pudding, etc.  Resume your normal diet the day after surgery. °5. Most patients will experience some swelling and bruising in the breast.  Ice packs and a good support bra will help.  Wear the breast binder provided or a sports bra for 72 hours day and night.  After that wear a sports bra during the day until you return to the office. Swelling and bruising can take several days to resolve.  °6. It is common to experience some constipation if taking pain medication after surgery.  Increasing fluid intake and taking a stool softener will usually help or prevent this problem from occurring.  A mild laxative (Milk of Magnesia or Miralax) should be taken according to package directions if there are no bowel movements after 48 hours. °7. Unless discharge instructions indicate otherwise, you may remove your bandages 48 hours after surgery and you may shower at that time.  You may have steri-strips (small skin tapes) in place directly over the incision.   These strips should be left on the skin for 7-10 days and will come off on their own.  If your surgeon used skin glue on the incision, you may shower in 24 hours.  The glue will flake off over the next 2-3 weeks.  Any sutures or staples will be removed at the office during your follow-up visit. °8. ACTIVITIES:  You may resume regular daily activities (gradually increasing) beginning the next day.  Wearing a good support bra or sports bra minimizes pain and swelling.  You may have sexual intercourse when it is comfortable. °a. You may drive when you no longer are taking prescription pain medication, you can comfortably wear a seatbelt, and you can safely maneuver your car and apply brakes. °b. RETURN TO WORK:  ______________________________________________________________________________________ °9. You should see your doctor in the office for a follow-up appointment approximately two weeks after your surgery.  Your doctor’s nurse will typically make your follow-up appointment when she calls you with your pathology report.  Expect your pathology report 3-4 business days after your surgery.  You may call to check if you do not hear from us after three days. °10. OTHER INSTRUCTIONS: _______________________________________________________________________________________________ _____________________________________________________________________________________________________________________________________ °_____________________________________________________________________________________________________________________________________ °_____________________________________________________________________________________________________________________________________ ° °WHEN TO CALL DR WAKEFIELD: °1. Fever over 101.0 °2. Nausea and/or vomiting. °3. Extreme swelling or bruising. °4. Continued bleeding from incision. °5. Increased pain, redness, or drainage from the incision. ° °The clinic staff is available to  answer your questions during regular business hours.  Please don’t hesitate to call and ask to speak to one of the nurses for   clinical concerns.  If you have a medical emergency, go to the nearest emergency room or call 911.  A surgeon from [facility] is always on call at the hospital. ° °For further questions, please visit [email or web address] ° °Post Anesthesia Home Care Instructions ° °Activity: °Get plenty of rest for the remainder of the day. A responsible adult should stay with you for 24 hours following the procedure.  °For the next 24 hours, DO NOT: °-Drive a car °-Operate machinery °-Drink alcoholic beverages °-Take any medication unless instructed by your physician °-Make any legal decisions or sign important papers. ° °Meals: °Start with liquid foods such as gelatin or soup. Progress to regular foods as tolerated. Avoid greasy, spicy, heavy foods. If nausea and/or vomiting occur, drink only clear liquids until the nausea and/or vomiting subsides. Call your physician if vomiting continues. ° °Special Instructions/Symptoms: °Your throat may feel dry or sore from the anesthesia or the breathing tube placed in your throat during surgery. If this causes discomfort, gargle with warm salt water. The discomfort should disappear within 24 hours. ° °

## 2014-04-24 NOTE — Interval H&P Note (Signed)
History and Physical Interval Note:  04/24/2014 12:51 PM  Lori Clay  has presented today for surgery, with the diagnosis of LEFT BREAST MASS  The various methods of treatment have been discussed with the patient and family. After consideration of risks, benefits and other options for treatment, the patient has consented to  Procedure(s): RADIOACTIVE SEED GUIDED EXCISIONAL BREAST BIOPSY (Left) as a surgical intervention .  The patient's history has been reviewed, patient examined, no change in status, stable for surgery.  I have reviewed the patient's chart and labs.  Questions were answered to the patient's satisfaction.     Jemya Depierro

## 2014-04-25 ENCOUNTER — Encounter (HOSPITAL_BASED_OUTPATIENT_CLINIC_OR_DEPARTMENT_OTHER): Payer: Self-pay | Admitting: General Surgery

## 2015-09-16 DIAGNOSIS — Z6823 Body mass index (BMI) 23.0-23.9, adult: Secondary | ICD-10-CM | POA: Diagnosis not present

## 2015-09-16 DIAGNOSIS — Z30430 Encounter for insertion of intrauterine contraceptive device: Secondary | ICD-10-CM | POA: Diagnosis not present

## 2015-09-16 DIAGNOSIS — Z3042 Encounter for surveillance of injectable contraceptive: Secondary | ICD-10-CM | POA: Diagnosis not present

## 2015-09-16 DIAGNOSIS — D259 Leiomyoma of uterus, unspecified: Secondary | ICD-10-CM | POA: Diagnosis not present

## 2015-09-16 DIAGNOSIS — Z1151 Encounter for screening for human papillomavirus (HPV): Secondary | ICD-10-CM | POA: Diagnosis not present

## 2015-09-16 DIAGNOSIS — Z01419 Encounter for gynecological examination (general) (routine) without abnormal findings: Secondary | ICD-10-CM | POA: Diagnosis not present

## 2015-09-16 DIAGNOSIS — Z30432 Encounter for removal of intrauterine contraceptive device: Secondary | ICD-10-CM | POA: Diagnosis not present

## 2015-10-09 DIAGNOSIS — K080 Exfoliation of teeth due to systemic causes: Secondary | ICD-10-CM | POA: Diagnosis not present

## 2015-10-18 DIAGNOSIS — Z0000 Encounter for general adult medical examination without abnormal findings: Secondary | ICD-10-CM | POA: Diagnosis not present

## 2016-04-10 DIAGNOSIS — Z0000 Encounter for general adult medical examination without abnormal findings: Secondary | ICD-10-CM | POA: Diagnosis not present

## 2016-04-14 DIAGNOSIS — K080 Exfoliation of teeth due to systemic causes: Secondary | ICD-10-CM | POA: Diagnosis not present

## 2016-05-14 DIAGNOSIS — J301 Allergic rhinitis due to pollen: Secondary | ICD-10-CM | POA: Diagnosis not present

## 2016-05-14 DIAGNOSIS — J3089 Other allergic rhinitis: Secondary | ICD-10-CM | POA: Diagnosis not present

## 2016-05-26 DIAGNOSIS — Z09 Encounter for follow-up examination after completed treatment for conditions other than malignant neoplasm: Secondary | ICD-10-CM | POA: Diagnosis not present

## 2016-06-22 DIAGNOSIS — J3089 Other allergic rhinitis: Secondary | ICD-10-CM | POA: Diagnosis not present

## 2016-06-22 DIAGNOSIS — J301 Allergic rhinitis due to pollen: Secondary | ICD-10-CM | POA: Diagnosis not present

## 2016-07-09 DIAGNOSIS — M549 Dorsalgia, unspecified: Secondary | ICD-10-CM | POA: Diagnosis not present

## 2016-07-20 DIAGNOSIS — J301 Allergic rhinitis due to pollen: Secondary | ICD-10-CM | POA: Diagnosis not present

## 2016-07-20 DIAGNOSIS — J3089 Other allergic rhinitis: Secondary | ICD-10-CM | POA: Diagnosis not present

## 2016-08-19 DIAGNOSIS — J3089 Other allergic rhinitis: Secondary | ICD-10-CM | POA: Diagnosis not present

## 2016-08-19 DIAGNOSIS — J301 Allergic rhinitis due to pollen: Secondary | ICD-10-CM | POA: Diagnosis not present

## 2016-09-08 DIAGNOSIS — J309 Allergic rhinitis, unspecified: Secondary | ICD-10-CM | POA: Diagnosis not present

## 2016-10-19 DIAGNOSIS — Z0000 Encounter for general adult medical examination without abnormal findings: Secondary | ICD-10-CM | POA: Diagnosis not present

## 2016-10-26 DIAGNOSIS — K080 Exfoliation of teeth due to systemic causes: Secondary | ICD-10-CM | POA: Diagnosis not present

## 2016-11-06 DIAGNOSIS — Z6824 Body mass index (BMI) 24.0-24.9, adult: Secondary | ICD-10-CM | POA: Diagnosis not present

## 2016-11-06 DIAGNOSIS — Z01419 Encounter for gynecological examination (general) (routine) without abnormal findings: Secondary | ICD-10-CM | POA: Diagnosis not present

## 2016-11-25 DIAGNOSIS — Z1321 Encounter for screening for nutritional disorder: Secondary | ICD-10-CM | POA: Diagnosis not present

## 2017-03-27 DIAGNOSIS — Z23 Encounter for immunization: Secondary | ICD-10-CM | POA: Diagnosis not present

## 2017-04-07 DIAGNOSIS — J3081 Allergic rhinitis due to animal (cat) (dog) hair and dander: Secondary | ICD-10-CM | POA: Diagnosis not present

## 2017-04-07 DIAGNOSIS — H1045 Other chronic allergic conjunctivitis: Secondary | ICD-10-CM | POA: Diagnosis not present

## 2017-04-07 DIAGNOSIS — J3089 Other allergic rhinitis: Secondary | ICD-10-CM | POA: Diagnosis not present

## 2017-04-07 DIAGNOSIS — J301 Allergic rhinitis due to pollen: Secondary | ICD-10-CM | POA: Diagnosis not present

## 2017-04-23 DIAGNOSIS — J3089 Other allergic rhinitis: Secondary | ICD-10-CM | POA: Diagnosis not present

## 2017-04-23 DIAGNOSIS — J301 Allergic rhinitis due to pollen: Secondary | ICD-10-CM | POA: Diagnosis not present

## 2017-04-30 DIAGNOSIS — J3089 Other allergic rhinitis: Secondary | ICD-10-CM | POA: Diagnosis not present

## 2017-04-30 DIAGNOSIS — J301 Allergic rhinitis due to pollen: Secondary | ICD-10-CM | POA: Diagnosis not present

## 2017-05-05 DIAGNOSIS — J3089 Other allergic rhinitis: Secondary | ICD-10-CM | POA: Diagnosis not present

## 2017-05-05 DIAGNOSIS — J301 Allergic rhinitis due to pollen: Secondary | ICD-10-CM | POA: Diagnosis not present

## 2017-05-10 DIAGNOSIS — K080 Exfoliation of teeth due to systemic causes: Secondary | ICD-10-CM | POA: Diagnosis not present

## 2017-05-12 DIAGNOSIS — J3089 Other allergic rhinitis: Secondary | ICD-10-CM | POA: Diagnosis not present

## 2017-05-12 DIAGNOSIS — J301 Allergic rhinitis due to pollen: Secondary | ICD-10-CM | POA: Diagnosis not present

## 2017-06-04 DIAGNOSIS — J3089 Other allergic rhinitis: Secondary | ICD-10-CM | POA: Diagnosis not present

## 2017-06-04 DIAGNOSIS — J301 Allergic rhinitis due to pollen: Secondary | ICD-10-CM | POA: Diagnosis not present

## 2017-06-09 DIAGNOSIS — J301 Allergic rhinitis due to pollen: Secondary | ICD-10-CM | POA: Diagnosis not present

## 2017-06-09 DIAGNOSIS — J3089 Other allergic rhinitis: Secondary | ICD-10-CM | POA: Diagnosis not present

## 2017-06-16 DIAGNOSIS — J3089 Other allergic rhinitis: Secondary | ICD-10-CM | POA: Diagnosis not present

## 2017-06-16 DIAGNOSIS — J301 Allergic rhinitis due to pollen: Secondary | ICD-10-CM | POA: Diagnosis not present

## 2017-07-27 DIAGNOSIS — Z1231 Encounter for screening mammogram for malignant neoplasm of breast: Secondary | ICD-10-CM | POA: Diagnosis not present

## 2017-08-04 DIAGNOSIS — R922 Inconclusive mammogram: Secondary | ICD-10-CM | POA: Diagnosis not present

## 2017-08-04 DIAGNOSIS — N6312 Unspecified lump in the right breast, upper inner quadrant: Secondary | ICD-10-CM | POA: Diagnosis not present

## 2017-08-11 ENCOUNTER — Other Ambulatory Visit: Payer: Self-pay | Admitting: Radiology

## 2017-08-11 DIAGNOSIS — D241 Benign neoplasm of right breast: Secondary | ICD-10-CM | POA: Diagnosis not present

## 2017-08-11 DIAGNOSIS — N649 Disorder of breast, unspecified: Secondary | ICD-10-CM | POA: Diagnosis not present

## 2017-09-10 ENCOUNTER — Other Ambulatory Visit: Payer: Self-pay | Admitting: General Surgery

## 2017-09-10 DIAGNOSIS — Z9189 Other specified personal risk factors, not elsewhere classified: Secondary | ICD-10-CM | POA: Diagnosis not present

## 2017-09-10 DIAGNOSIS — Z1231 Encounter for screening mammogram for malignant neoplasm of breast: Secondary | ICD-10-CM

## 2017-09-10 DIAGNOSIS — R928 Other abnormal and inconclusive findings on diagnostic imaging of breast: Secondary | ICD-10-CM | POA: Diagnosis not present

## 2017-09-22 ENCOUNTER — Ambulatory Visit (HOSPITAL_COMMUNITY)
Admission: RE | Admit: 2017-09-22 | Discharge: 2017-09-22 | Disposition: A | Discharge status: Home / Self Care | Payer: Federal, State, Local not specified - PPO | Source: Ambulatory Visit | Attending: General Surgery | Admitting: General Surgery

## 2017-09-22 ENCOUNTER — Ambulatory Visit (HOSPITAL_COMMUNITY): Payer: Federal, State, Local not specified - PPO

## 2017-09-22 DIAGNOSIS — Z1231 Encounter for screening mammogram for malignant neoplasm of breast: Secondary | ICD-10-CM | POA: Diagnosis not present

## 2017-09-22 DIAGNOSIS — N6312 Unspecified lump in the right breast, upper inner quadrant: Secondary | ICD-10-CM | POA: Diagnosis not present

## 2017-09-22 DIAGNOSIS — N6310 Unspecified lump in the right breast, unspecified quadrant: Secondary | ICD-10-CM | POA: Insufficient documentation

## 2017-09-22 MED ORDER — GADOBENATE DIMEGLUMINE 529 MG/ML IV SOLN
15.0000 mL | Freq: Once | INTRAVENOUS | Status: AC | PRN
  Administered 2017-09-22: 13 mL via INTRAVENOUS

## 2017-09-29 ENCOUNTER — Other Ambulatory Visit: Payer: Self-pay | Admitting: General Surgery

## 2017-09-29 DIAGNOSIS — N6310 Unspecified lump in the right breast, unspecified quadrant: Secondary | ICD-10-CM

## 2017-09-29 DIAGNOSIS — N6320 Unspecified lump in the left breast, unspecified quadrant: Secondary | ICD-10-CM

## 2017-10-12 DIAGNOSIS — N6314 Unspecified lump in the right breast, lower inner quadrant: Secondary | ICD-10-CM | POA: Diagnosis not present

## 2017-10-12 DIAGNOSIS — Z803 Family history of malignant neoplasm of breast: Secondary | ICD-10-CM | POA: Diagnosis not present

## 2017-10-14 ENCOUNTER — Other Ambulatory Visit: Payer: Federal, State, Local not specified - PPO

## 2017-10-25 ENCOUNTER — Other Ambulatory Visit: Payer: Self-pay | Admitting: General Surgery

## 2017-10-25 DIAGNOSIS — D241 Benign neoplasm of right breast: Secondary | ICD-10-CM

## 2017-11-08 DIAGNOSIS — K080 Exfoliation of teeth due to systemic causes: Secondary | ICD-10-CM | POA: Diagnosis not present

## 2017-11-17 ENCOUNTER — Other Ambulatory Visit: Payer: Self-pay

## 2017-11-17 ENCOUNTER — Encounter (HOSPITAL_BASED_OUTPATIENT_CLINIC_OR_DEPARTMENT_OTHER): Payer: Self-pay | Admitting: *Deleted

## 2017-11-22 DIAGNOSIS — D241 Benign neoplasm of right breast: Secondary | ICD-10-CM | POA: Diagnosis not present

## 2017-11-22 NOTE — Progress Notes (Signed)
Ensure pre surgery drink given with instructions to complete by 0500 dos, pt verbalized understanding. 

## 2017-11-24 ENCOUNTER — Ambulatory Visit (HOSPITAL_BASED_OUTPATIENT_CLINIC_OR_DEPARTMENT_OTHER): Payer: Federal, State, Local not specified - PPO | Admitting: Anesthesiology

## 2017-11-24 ENCOUNTER — Ambulatory Visit (HOSPITAL_BASED_OUTPATIENT_CLINIC_OR_DEPARTMENT_OTHER)
Admission: RE | Admit: 2017-11-24 | Discharge: 2017-11-24 | Disposition: A | Discharge status: Home / Self Care | Payer: Federal, State, Local not specified - PPO | Source: Ambulatory Visit | Attending: General Surgery | Admitting: General Surgery

## 2017-11-24 ENCOUNTER — Encounter (HOSPITAL_BASED_OUTPATIENT_CLINIC_OR_DEPARTMENT_OTHER): Payer: Self-pay | Admitting: Anesthesiology

## 2017-11-24 ENCOUNTER — Encounter (HOSPITAL_BASED_OUTPATIENT_CLINIC_OR_DEPARTMENT_OTHER)
Admission: RE | Disposition: A | Discharge status: Home / Self Care | Payer: Self-pay | Source: Ambulatory Visit | Attending: General Surgery

## 2017-11-24 DIAGNOSIS — N6310 Unspecified lump in the right breast, unspecified quadrant: Secondary | ICD-10-CM | POA: Insufficient documentation

## 2017-11-24 DIAGNOSIS — D241 Benign neoplasm of right breast: Secondary | ICD-10-CM | POA: Diagnosis not present

## 2017-11-24 DIAGNOSIS — N6011 Diffuse cystic mastopathy of right breast: Secondary | ICD-10-CM | POA: Diagnosis not present

## 2017-11-24 HISTORY — DX: Unspecified lump in the right breast, unspecified quadrant: N63.10

## 2017-11-24 HISTORY — PX: RADIOACTIVE SEED GUIDED EXCISIONAL BREAST BIOPSY: SHX6490

## 2017-11-24 SURGERY — RADIOACTIVE SEED GUIDED BREAST BIOPSY
Anesthesia: General | Site: Breast | Laterality: Right

## 2017-11-24 MED ORDER — PROMETHAZINE HCL 25 MG/ML IJ SOLN: 6.2500 mg | INTRAMUSCULAR | Status: DC | PRN

## 2017-11-24 MED ORDER — MIDAZOLAM HCL 2 MG/2ML IJ SOLN
1.0000 mg | INTRAMUSCULAR | Status: DC | PRN
  Administered 2017-11-24: 0.5 mg via INTRAVENOUS

## 2017-11-24 MED ORDER — GABAPENTIN 100 MG PO CAPS
100.0000 mg | ORAL_CAPSULE | ORAL | Status: AC
  Administered 2017-11-24: 300 mg via ORAL

## 2017-11-24 MED ORDER — CEFAZOLIN SODIUM-DEXTROSE 2-4 GM/100ML-% IV SOLN
INTRAVENOUS | Status: AC
  Filled 2017-11-24: qty 100

## 2017-11-24 MED ORDER — ACETAMINOPHEN 500 MG PO TABS
ORAL_TABLET | ORAL | Status: AC
  Filled 2017-11-24: qty 2

## 2017-11-24 MED ORDER — FENTANYL CITRATE (PF) 100 MCG/2ML IJ SOLN
INTRAMUSCULAR | Status: AC
  Filled 2017-11-24: qty 2

## 2017-11-24 MED ORDER — ENSURE PRE-SURGERY PO LIQD: 296.0000 mL | Freq: Once | ORAL | Status: DC

## 2017-11-24 MED ORDER — PROPOFOL 10 MG/ML IV BOLUS
INTRAVENOUS | Status: AC
  Filled 2017-11-24: qty 20

## 2017-11-24 MED ORDER — OXYCODONE HCL 5 MG/5ML PO SOLN: 5.0000 mg | Freq: Once | ORAL | Status: DC | PRN

## 2017-11-24 MED ORDER — ACETAMINOPHEN 500 MG PO TABS
1000.0000 mg | ORAL_TABLET | ORAL | Status: AC
  Administered 2017-11-24: 1000 mg via ORAL

## 2017-11-24 MED ORDER — ONDANSETRON HCL 4 MG/2ML IJ SOLN
INTRAMUSCULAR | Status: AC
  Filled 2017-11-24: qty 2

## 2017-11-24 MED ORDER — CELECOXIB 400 MG PO CAPS
400.0000 mg | ORAL_CAPSULE | ORAL | Status: AC
  Administered 2017-11-24: 200 mg via ORAL

## 2017-11-24 MED ORDER — CEFAZOLIN SODIUM-DEXTROSE 2-4 GM/100ML-% IV SOLN
2.0000 g | INTRAVENOUS | Status: AC
  Administered 2017-11-24 (×2): 2 g via INTRAVENOUS

## 2017-11-24 MED ORDER — CHLORHEXIDINE GLUCONATE CLOTH 2 % EX PADS: 6.0000 | MEDICATED_PAD | Freq: Once | CUTANEOUS | Status: DC

## 2017-11-24 MED ORDER — BUPIVACAINE HCL (PF) 0.25 % IJ SOLN
INTRAMUSCULAR | Status: DC | PRN
  Administered 2017-11-24: 3 mL

## 2017-11-24 MED ORDER — EPHEDRINE SULFATE 50 MG/ML IJ SOLN
INTRAMUSCULAR | Status: AC
  Filled 2017-11-24: qty 1

## 2017-11-24 MED ORDER — SCOPOLAMINE 1 MG/3DAYS TD PT72: 1.0000 | MEDICATED_PATCH | Freq: Once | TRANSDERMAL | Status: DC | PRN

## 2017-11-24 MED ORDER — KETOROLAC TROMETHAMINE 30 MG/ML IJ SOLN: 30.0000 mg | Freq: Once | INTRAMUSCULAR | Status: DC | PRN

## 2017-11-24 MED ORDER — GABAPENTIN 300 MG PO CAPS
ORAL_CAPSULE | ORAL | Status: AC
  Filled 2017-11-24: qty 1

## 2017-11-24 MED ORDER — LIDOCAINE HCL (CARDIAC) PF 100 MG/5ML IV SOSY
PREFILLED_SYRINGE | INTRAVENOUS | Status: AC
  Filled 2017-11-24: qty 5

## 2017-11-24 MED ORDER — SUCCINYLCHOLINE CHLORIDE 200 MG/10ML IV SOSY
PREFILLED_SYRINGE | INTRAVENOUS | Status: AC
  Filled 2017-11-24: qty 10

## 2017-11-24 MED ORDER — LIDOCAINE-EPINEPHRINE (PF) 1 %-1:200000 IJ SOLN
INTRAMUSCULAR | Status: AC
  Filled 2017-11-24: qty 30

## 2017-11-24 MED ORDER — FENTANYL CITRATE (PF) 100 MCG/2ML IJ SOLN: 25.0000 ug | INTRAMUSCULAR | Status: DC | PRN

## 2017-11-24 MED ORDER — BUPIVACAINE HCL (PF) 0.25 % IJ SOLN
INTRAMUSCULAR | Status: AC
  Filled 2017-11-24: qty 90

## 2017-11-24 MED ORDER — FENTANYL CITRATE (PF) 100 MCG/2ML IJ SOLN
50.0000 ug | INTRAMUSCULAR | Status: DC | PRN
  Administered 2017-11-24: 50 ug via INTRAVENOUS

## 2017-11-24 MED ORDER — EPHEDRINE SULFATE 50 MG/ML IJ SOLN
INTRAMUSCULAR | Status: DC | PRN
  Administered 2017-11-24: 10 mg via INTRAVENOUS

## 2017-11-24 MED ORDER — PROPOFOL 10 MG/ML IV BOLUS
INTRAVENOUS | Status: DC | PRN
  Administered 2017-11-24: 200 mg via INTRAVENOUS

## 2017-11-24 MED ORDER — LACTATED RINGERS IV SOLN
INTRAVENOUS | Status: DC
  Administered 2017-11-24 (×2): via INTRAVENOUS

## 2017-11-24 MED ORDER — PHENYLEPHRINE 40 MCG/ML (10ML) SYRINGE FOR IV PUSH (FOR BLOOD PRESSURE SUPPORT)
PREFILLED_SYRINGE | INTRAVENOUS | Status: AC
  Filled 2017-11-24: qty 10

## 2017-11-24 MED ORDER — MIDAZOLAM HCL 2 MG/2ML IJ SOLN
INTRAMUSCULAR | Status: AC
  Filled 2017-11-24: qty 2

## 2017-11-24 MED ORDER — LIDOCAINE HCL (CARDIAC) PF 100 MG/5ML IV SOSY
PREFILLED_SYRINGE | INTRAVENOUS | Status: DC | PRN
  Administered 2017-11-24: 60 mg via INTRAVENOUS

## 2017-11-24 MED ORDER — DEXAMETHASONE SODIUM PHOSPHATE 4 MG/ML IJ SOLN
INTRAMUSCULAR | Status: DC | PRN
  Administered 2017-11-24: 10 mg via INTRAVENOUS

## 2017-11-24 MED ORDER — OXYCODONE HCL 5 MG PO TABS: 5.0000 mg | ORAL_TABLET | Freq: Once | ORAL | Status: DC | PRN

## 2017-11-24 MED ORDER — DEXAMETHASONE SODIUM PHOSPHATE 10 MG/ML IJ SOLN
INTRAMUSCULAR | Status: AC
Start: 2017-11-24 — End: ?
  Filled 2017-11-24: qty 1

## 2017-11-24 MED ORDER — CELECOXIB 200 MG PO CAPS
ORAL_CAPSULE | ORAL | Status: AC
  Filled 2017-11-24: qty 2

## 2017-11-24 SURGICAL SUPPLY — 35 items
BLADE SURG 15 STRL LF DISP TIS (BLADE) ×1 IMPLANT
BLADE SURG 15 STRL SS (BLADE) ×1
CHLORAPREP W/TINT 26ML (MISCELLANEOUS) ×2 IMPLANT
COVER BACK TABLE 60X90IN (DRAPES) ×2 IMPLANT
COVER MAYO STAND STRL (DRAPES) ×2 IMPLANT
COVER PROBE W GEL 5X96 (DRAPES) ×2 IMPLANT
DERMABOND ADVANCED (GAUZE/BANDAGES/DRESSINGS) ×1
DERMABOND ADVANCED .7 DNX12 (GAUZE/BANDAGES/DRESSINGS) ×1 IMPLANT
DEVICE DUBIN W/COMP PLATE 8390 (MISCELLANEOUS) ×2 IMPLANT
DRAPE LAPAROSCOPIC ABDOMINAL (DRAPES) ×2 IMPLANT
DRAPE UTILITY XL STRL (DRAPES) ×2 IMPLANT
ELECT COATED BLADE 2.86 ST (ELECTRODE) ×2 IMPLANT
ELECT REM PT RETURN 9FT ADLT (ELECTROSURGICAL) ×2
ELECTRODE REM PT RTRN 9FT ADLT (ELECTROSURGICAL) ×1 IMPLANT
GAUZE SPONGE 4X4 12PLY STRL LF (GAUZE/BANDAGES/DRESSINGS) IMPLANT
GLOVE BIO SURGEON STRL SZ7 (GLOVE) ×4 IMPLANT
GLOVE BIOGEL PI IND STRL 7.5 (GLOVE) ×1 IMPLANT
GLOVE BIOGEL PI INDICATOR 7.5 (GLOVE) ×1
GOWN STRL REUS W/ TWL LRG LVL3 (GOWN DISPOSABLE) ×2 IMPLANT
GOWN STRL REUS W/TWL LRG LVL3 (GOWN DISPOSABLE) ×2
KIT MARKER MARGIN INK (KITS) ×2 IMPLANT
NEEDLE HYPO 25X1 1.5 SAFETY (NEEDLE) ×2 IMPLANT
PACK BASIN DAY SURGERY FS (CUSTOM PROCEDURE TRAY) ×2 IMPLANT
PENCIL BUTTON HOLSTER BLD 10FT (ELECTRODE) ×2 IMPLANT
SLEEVE SCD COMPRESS KNEE MED (MISCELLANEOUS) ×2 IMPLANT
SPONGE LAP 4X18 RFD (DISPOSABLE) ×2 IMPLANT
STRIP CLOSURE SKIN 1/2X4 (GAUZE/BANDAGES/DRESSINGS) ×2 IMPLANT
SUT MON AB 5-0 PS2 18 (SUTURE) ×2 IMPLANT
SUT VIC AB 2-0 SH 27 (SUTURE) ×2
SUT VIC AB 2-0 SH 27XBRD (SUTURE) ×1 IMPLANT
SUT VIC AB 3-0 SH 27 (SUTURE) ×1
SUT VIC AB 3-0 SH 27X BRD (SUTURE) ×1 IMPLANT
SYR CONTROL 10ML LL (SYRINGE) ×2 IMPLANT
TOWEL GREEN STERILE FF (TOWEL DISPOSABLE) ×2 IMPLANT
TOWEL OR NON WOVEN STRL DISP B (DISPOSABLE) ×2 IMPLANT

## 2017-11-24 NOTE — Anesthesia Postprocedure Evaluation (Signed)
Anesthesia Post Note  Patient: Lori Clay  Procedure(s) Performed: RIGHT BREAST RADIOACTIVE SEED GUIDED EXCISIONAL BIOPSY (Right Breast)     Patient location during evaluation: PACU Anesthesia Type: General Level of consciousness: awake and alert Pain management: pain level controlled Vital Signs Assessment: post-procedure vital signs reviewed and stable Respiratory status: spontaneous breathing, nonlabored ventilation, respiratory function stable and patient connected to nasal cannula oxygen Cardiovascular status: blood pressure returned to baseline and stable Postop Assessment: no apparent nausea or vomiting Anesthetic complications: no    Last Vitals:  Vitals:   11/24/17 0945 11/24/17 1004  BP: 114/71 116/68  Pulse: 93 70  Resp: 19 18  Temp:  36.7 C  SpO2: 100% 100%    Last Pain:  Vitals:   11/24/17 1004  TempSrc:   PainSc: 0-No pain                 Lequisha Cammack S

## 2017-11-24 NOTE — Anesthesia Preprocedure Evaluation (Signed)
Anesthesia Evaluation  Patient identified by MRN, date of birth, ID band Patient awake    Reviewed: Allergy & Precautions, NPO status , Patient's Chart, lab work & pertinent test results  Airway Mallampati: II  TM Distance: >3 FB Neck ROM: Full    Dental no notable dental hx.    Pulmonary neg pulmonary ROS,    Pulmonary exam normal breath sounds clear to auscultation       Cardiovascular negative cardio ROS Normal cardiovascular exam Rhythm:Regular Rate:Normal     Neuro/Psych negative neurological ROS  negative psych ROS   GI/Hepatic negative GI ROS, Neg liver ROS,   Endo/Other  negative endocrine ROS  Renal/GU negative Renal ROS  negative genitourinary   Musculoskeletal negative musculoskeletal ROS (+)   Abdominal   Peds negative pediatric ROS (+)  Hematology negative hematology ROS (+)   Anesthesia Other Findings   Reproductive/Obstetrics negative OB ROS                             Anesthesia Physical Anesthesia Plan  ASA: I  Anesthesia Plan: General   Post-op Pain Management:    Induction: Intravenous  PONV Risk Score and Plan: 3 and Ondansetron, Dexamethasone, Midazolam and Treatment may vary due to age or medical condition  Airway Management Planned: LMA  Additional Equipment:   Intra-op Plan:   Post-operative Plan: Extubation in OR  Informed Consent: I have reviewed the patients History and Physical, chart, labs and discussed the procedure including the risks, benefits and alternatives for the proposed anesthesia with the patient or authorized representative who has indicated his/her understanding and acceptance.   Dental advisory given  Plan Discussed with: CRNA and Surgeon  Anesthesia Plan Comments:         Anesthesia Quick Evaluation

## 2017-11-24 NOTE — Transfer of Care (Signed)
Immediate Anesthesia Transfer of Care Note  Patient: Lori Clay  Procedure(s) Performed: RIGHT BREAST RADIOACTIVE SEED GUIDED EXCISIONAL BIOPSY (Right Breast)  Patient Location: PACU  Anesthesia Type:General  Level of Consciousness: sedated  Airway & Oxygen Therapy: Patient Spontanous Breathing and Patient connected to face mask oxygen  Post-op Assessment: Report given to RN and Post -op Vital signs reviewed and stable  Post vital signs: Reviewed and stable  Last Vitals:  Vitals Value Taken Time  BP    Temp    Pulse 59 11/24/2017  9:15 AM  Resp 12 11/24/2017  9:15 AM  SpO2 100 % 11/24/2017  9:15 AM    Last Pain:  Vitals:   11/24/17 0820  TempSrc: Oral  PainSc: 0-No pain         Complications: No apparent anesthesia complications

## 2017-11-24 NOTE — Anesthesia Procedure Notes (Signed)
Procedure Name: LMA Insertion Date/Time: 11/24/2017 8:35 AM Performed by: Willa Frater, CRNA Pre-anesthesia Checklist: Patient identified, Emergency Drugs available, Suction available and Patient being monitored Patient Re-evaluated:Patient Re-evaluated prior to induction Oxygen Delivery Method: Circle system utilized Preoxygenation: Pre-oxygenation with 100% oxygen Induction Type: IV induction Ventilation: Mask ventilation without difficulty LMA: LMA inserted LMA Size: 4.0 Number of attempts: 1 Airway Equipment and Method: Bite block Placement Confirmation: positive ETCO2 Tube secured with: Tape Dental Injury: Teeth and Oropharynx as per pre-operative assessment

## 2017-11-24 NOTE — Op Note (Signed)
Preoperative diagnosis: Right breast mammographic abnormality with biopsy showing papillary lesion Postoperative diagnosis: same as above Procedure: Right breast seed guided excisional biopsy Surgeon: Dr Serita Grammes Anes: general EBL: minimal Specimens: right breast tissue marked with paint Complications: None  drains: None Sponge count was correct at completion Disposition to recovery stable  Indications: [name]  I discussed options including observation. She desired excision. A seed was placed prior to beginning and I had these mm in the OR. She also has had mri due to risk and this is otherwise normal after second look [place].   Procedure: After informed consent was obtained the patient was taken to the operating room. She had been given antibiotics. SCDs were in place. She was placed under general anesthesia without complication. Her breast was prepped and draped in the standard sterile surgical fashion. Surgical timeout was then performed.  I located the radioactive seed in the upper outer quadrant. I infiltrated Marcaine around the region of the seed and around the areola. I made a periareolar incision to hide the scar. I then tunneled to the lesion. With the neoprobe guide in the excision I removed the seed and the surrounding tissue.the posterior margin of this is the implant capsule. I then passed this off the table after marking it with paint. Mammogram confirmed removal of the seed. I then obtained hemostasis. This was closed with 3-0 Vicryl, and 5-0 Monocryl. Glue and Steri-Strips are placed. She tolerated this well was extubated and transferred to recovery stable.

## 2017-11-24 NOTE — Discharge Instructions (Signed)
[place] [facility] Phone Number [phone]   POST OP INSTRUCTIONS Take 400 mg of ibuprofen every 8 hours for next 48 hours and use ice daily for first 72 hours.  Always review your discharge instruction sheet given to you by the facility where your surgery was performed.  IF YOU HAVE DISABILITY OR FAMILY LEAVE FORMS, YOU MUST BRING THEM TO THE OFFICE FOR PROCESSING.  DO NOT GIVE THEM TO YOUR DOCTOR.  1. A prescription for pain medication may be given to you upon discharge.  Take your pain medication as prescribed, if needed.  If narcotic pain medicine is not needed, then you may take acetaminophen (Tylenol), naprosyn (Alleve) or ibuprofen (Advil) as needed. 2. Take your usually prescribed medications unless otherwise directed 3. If you need a refill on your pain medication, please contact your pharmacy.  They will contact our office to request authorization.  Prescriptions will not be filled after 5pm or on week-ends. 4. You should eat very light the first 24 hours after surgery, such as soup, crackers, pudding, etc.  Resume your normal diet the day after surgery. 5. Most patients will experience some swelling and bruising in the breast.  Ice packs and a good support bra will help.  Wear the breast binder provided or a sports bra for 72 hours day and night.  After that wear a sports bra during the day until you return to the office. Swelling and bruising can take several days to resolve.  6. It is common to experience some constipation if taking pain medication after surgery.  Increasing fluid intake and taking a stool softener will usually help or prevent this problem from occurring.  A mild laxative (Milk of Magnesia or Miralax) should be taken according to package directions if there are no bowel movements after 48 hours. 7. Unless discharge instructions indicate otherwise, you may remove your bandages 48 hours after surgery and you may shower at that time.  You may have  steri-strips (small skin tapes) in place directly over the incision.  These strips should be left on the skin for 7-10 days and will come off on their own.  If your surgeon used skin glue on the incision, you may shower in 24 hours.  The glue will flake off over the next 2-3 weeks.  Any sutures or staples will be removed at the office during your follow-up visit. 8. ACTIVITIES:  You may resume regular daily activities (gradually increasing) beginning the next day.  Wearing a good support bra or sports bra minimizes pain and swelling.  You may have sexual intercourse when it is comfortable. a. You may drive when you no longer are taking prescription pain medication, you can comfortably wear a seatbelt, and you can safely maneuver your car and apply brakes. b. RETURN TO WORK:  ______________________________________________________________________________________ 9. You should see your doctor in the office for a follow-up appointment approximately two weeks after your surgery.  Your doctors nurse will typically make your follow-up appointment when she calls you with your pathology report.  Expect your pathology report 3-4 business days after your surgery.  You may call to check if you do not hear from [place] after three days. 10. OTHER INSTRUCTIONS: _______________________________________________________________________________________________ _____________________________________________________________________________________________________________________________________ _____________________________________________________________________________________________________________________________________ _____________________________________________________________________________________________________________________________________  WHEN TO CALL DR WAKEFIELD: 1. Fever over 101.0 2. Nausea and/or vomiting. 3. Extreme swelling or bruising. 4. Continued bleeding from incision. 5. Increased pain, redness,  or drainage from the incision.  The clinic staff is available to answer your questions during regular business  hours.  Please dont hesitate to call and ask to speak to one of the nurses for clinical concerns.  If you have a medical emergency, go to the nearest emergency room or call 911.  A surgeon from [facility] is always on call at the hospital.  For further questions, please visit [email or web address]    Post Anesthesia Home Care Instructions  Activity: Get plenty of rest for the remainder of the day. A responsible individual must stay with you for 24 hours following the procedure.  For the next 24 hours, DO NOT: -Drive a car -Paediatric nurse -Drink alcoholic beverages -Take any medication unless instructed by your physician -Make any legal decisions or sign important papers.  Meals: Start with liquid foods such as gelatin or soup. Progress to regular foods as tolerated. Avoid greasy, spicy, heavy foods. If nausea and/or vomiting occur, drink only clear liquids until the nausea and/or vomiting subsides. Call your physician if vomiting continues.  Special Instructions/Symptoms: Your throat may feel dry or sore from the anesthesia or the breathing tube placed in your throat during surgery. If this causes discomfort, gargle with warm salt water. The discomfort should disappear within 24 hours.  If you had a scopolamine patch placed behind your ear for the management of post- operative nausea and/or vomiting:  1. The medication in the patch is effective for 72 hours, after which it should be removed.  Wrap patch in a tissue and discard in the trash. Wash hands thoroughly with soap and water. 2. You may remove the patch earlier than 72 hours if you experience unpleasant side effects which may include dry mouth, dizziness or visual disturbances. 3. Avoid touching the patch. Wash your hands with soap and water after contact with the patch.

## 2017-11-24 NOTE — Interval H&P Note (Signed)
History and Physical Interval Note:  11/24/2017 7:51 AM  Lori Clay  has presented today for surgery, with the diagnosis of RIGHT BREAST MASS  The various methods of treatment have been discussed with the patient and family. After consideration of risks, benefits and other options for treatment, the patient has consented to  Procedure(s): RIGHT BREAST RADIOACTIVE SEED GUIDED EXCISIONAL BIOPSY (Right) as a surgical intervention .  The patient's history has been reviewed, patient examined, no change in status, stable for surgery.  I have reviewed the patient's chart and labs.  Questions were answered to the patient's satisfaction.     Rolm Bookbinder

## 2017-11-24 NOTE — H&P (Signed)
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 she does not have mass or dc. she had screening mm that shows d density breasts. she has a 5 mm focal asymmetry in the right breast medially. US shows a 4 mm mass present. axilla was normal. US guided biopsy with clip placement done with path showing fragment of papillary lesion. since last time I have seen her mother has had breast cancer.   Past Surgical History  Breast Augmentation  Bilateral. Cesarean Section - 1  Oral Surgery   Diagnostic Studies History  Colonoscopy  never Mammogram  within last year Pap Smear  1-5 years ago  Allergies  No Known Drug Allergies  Allergies Reconciled   Medication History Vitamin E (Oral) Specific strength unknown - Active. ZyrTEC Allergy (Oral) Specific strength unknown - Active. Medications Reconciled  Social History  Alcohol use  Occasional alcohol use. Caffeine use  Carbonated beverages, Coffee, Tea. No drug use  Tobacco use  Never smoker.  Family History Colon Polyps  Father. Diabetes Mellitus  Father. Heart disease in female family member before age 39  Heart disease in female family member before age 33  Hypertension  Father, Sister. Breast Cancer  Mother.  Vitals  Weight: 149.13 lb Height: 64.5in Body Surface Area: 1.74 m Body Mass Index: 25.2 kg/m  Temp.: 99.75F(Oral)  Pulse: 78 (Regular)  BP: 120/68 (Sitting, Right Arm, Standard) Physical Exam  General Mental Status-Alert. Head and Neck Trachea-midline. Eye Sclera/Conjunctiva - Bilateral-No scleral icterus. Chest and Lung Exam Chest and lung exam reveals -quiet, even and easy respiratory effort with no use of accessory muscles and on auscultation, normal breath sounds, no adventitious sounds and normal vocal  resonance. Breast Nipples-No Discharge. Breast Lump-No Palpable Breast Mass. Note: healed left breast incision bilateral implants Cardiovascular Cardiovascular examination reveals -normal heart sounds, regular rate and rhythm with no murmurs. Neurologic Neurologic evaluation reveals -alert and oriented x 3 with no impairment of recent or remote memory. Lymphatic Head & Neck General Head & Neck Lymphatics: Bilateral - Description - Normal. Axillary General Axillary Region: Bilateral - Description - Normal. Note: no Mims adenopathy   Assessment & Plan Rolm Bookbinder MD; 09/10/2017 5:16 PM) ABNORMAL MAMMOGRAM OF RIGHT BREAST (R92.8) Story: discussed seed guided excision of this area. discussed option of observation but I think as she is high risk this would not be wise.

## 2017-11-25 ENCOUNTER — Encounter (HOSPITAL_BASED_OUTPATIENT_CLINIC_OR_DEPARTMENT_OTHER): Payer: Self-pay | Admitting: General Surgery

## 2017-12-14 DIAGNOSIS — Z1151 Encounter for screening for human papillomavirus (HPV): Secondary | ICD-10-CM | POA: Diagnosis not present

## 2017-12-14 DIAGNOSIS — Z01419 Encounter for gynecological examination (general) (routine) without abnormal findings: Secondary | ICD-10-CM | POA: Diagnosis not present

## 2018-02-18 DIAGNOSIS — Z13220 Encounter for screening for lipoid disorders: Secondary | ICD-10-CM | POA: Diagnosis not present

## 2018-02-18 DIAGNOSIS — Z23 Encounter for immunization: Secondary | ICD-10-CM | POA: Diagnosis not present

## 2018-02-18 DIAGNOSIS — Z0000 Encounter for general adult medical examination without abnormal findings: Secondary | ICD-10-CM | POA: Diagnosis not present

## 2018-04-05 DIAGNOSIS — Z13220 Encounter for screening for lipoid disorders: Secondary | ICD-10-CM | POA: Diagnosis not present

## 2018-04-05 DIAGNOSIS — L309 Dermatitis, unspecified: Secondary | ICD-10-CM | POA: Diagnosis not present

## 2018-04-05 DIAGNOSIS — Z131 Encounter for screening for diabetes mellitus: Secondary | ICD-10-CM | POA: Diagnosis not present

## 2018-06-01 DIAGNOSIS — K080 Exfoliation of teeth due to systemic causes: Secondary | ICD-10-CM | POA: Diagnosis not present

## 2018-07-18 DIAGNOSIS — K080 Exfoliation of teeth due to systemic causes: Secondary | ICD-10-CM | POA: Diagnosis not present

## 2018-07-19 DIAGNOSIS — Z09 Encounter for follow-up examination after completed treatment for conditions other than malignant neoplasm: Secondary | ICD-10-CM | POA: Diagnosis not present

## 2018-12-29 DIAGNOSIS — M1310 Monoarthritis, not elsewhere classified, unspecified site: Secondary | ICD-10-CM | POA: Diagnosis not present

## 2019-01-02 DIAGNOSIS — M1310 Monoarthritis, not elsewhere classified, unspecified site: Secondary | ICD-10-CM | POA: Diagnosis not present

## 2019-01-05 DIAGNOSIS — K080 Exfoliation of teeth due to systemic causes: Secondary | ICD-10-CM | POA: Diagnosis not present

## 2019-02-21 ENCOUNTER — Other Ambulatory Visit: Payer: Self-pay | Admitting: General Surgery

## 2019-02-21 DIAGNOSIS — Z9189 Other specified personal risk factors, not elsewhere classified: Secondary | ICD-10-CM

## 2019-02-24 DIAGNOSIS — Z23 Encounter for immunization: Secondary | ICD-10-CM | POA: Diagnosis not present

## 2019-03-01 DIAGNOSIS — Z01818 Encounter for other preprocedural examination: Secondary | ICD-10-CM | POA: Diagnosis not present

## 2019-03-02 DIAGNOSIS — Z0000 Encounter for general adult medical examination without abnormal findings: Secondary | ICD-10-CM | POA: Diagnosis not present

## 2019-03-02 DIAGNOSIS — Z13220 Encounter for screening for lipoid disorders: Secondary | ICD-10-CM | POA: Diagnosis not present

## 2019-03-07 ENCOUNTER — Ambulatory Visit
Admission: RE | Admit: 2019-03-07 | Discharge: 2019-03-07 | Disposition: A | Discharge status: Home / Self Care | Payer: Federal, State, Local not specified - PPO | Source: Ambulatory Visit | Attending: General Surgery | Admitting: General Surgery

## 2019-03-07 ENCOUNTER — Other Ambulatory Visit: Payer: Self-pay

## 2019-03-07 DIAGNOSIS — Z803 Family history of malignant neoplasm of breast: Secondary | ICD-10-CM | POA: Diagnosis not present

## 2019-03-07 DIAGNOSIS — Z9189 Other specified personal risk factors, not elsewhere classified: Secondary | ICD-10-CM

## 2019-03-07 IMAGING — MR MR BREAST BILAT WO/W CM
8 of 12 series · 33 of 48 positions shown · IV contrast (6 ml gadavist)
Comparison: Screening mammogram on [DATE]

CLINICAL DATA: Family history of breast cancer diagnosed in the
patient's mother at age 60. Previous bilateral benign biopsies.
Implants 7 years ago.

LABS:  None obtained at the time of imaging.
EXAM:
BILATERAL BREAST MRI WITH AND WITHOUT CONTRAST
TECHNIQUE: Multiplanar, multisequence MR images of both breasts were obtained
prior to and following the intravenous administration of 6 ml of
Gadavist

[Series 2: t2_tirm_tra ipat (a-p) · axial · 3.0mm · 0.70mm/px · 1 of 55 slices shown]
[im 1/55]
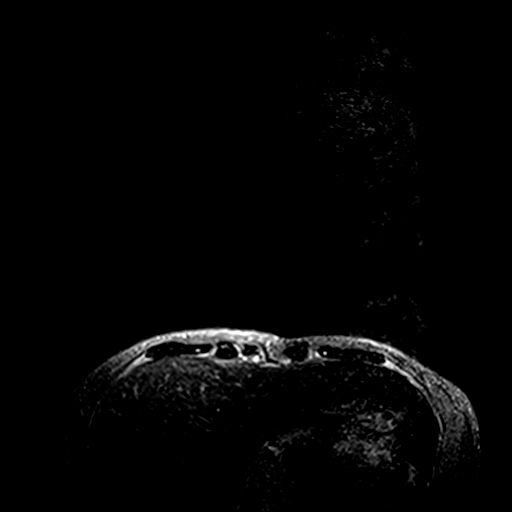

[Series 3: fl3d pre-cm no · axial · non-contrast · 1.2mm · 0.94mm/px · z∈[-96,+76]mm · 5 of 144 slices shown]
[im 1/144]
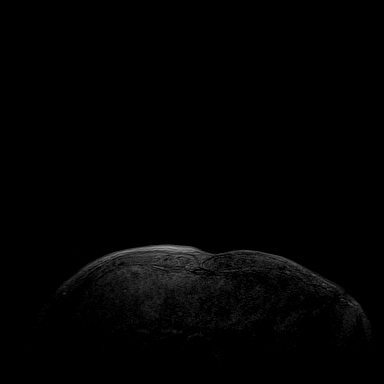
[im 36/144]
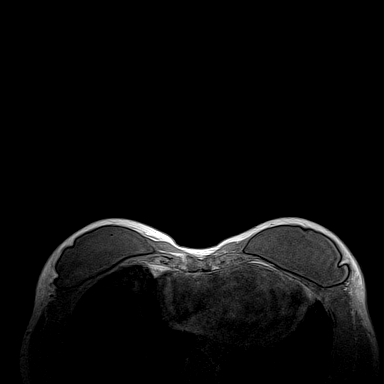
[im 72/144]
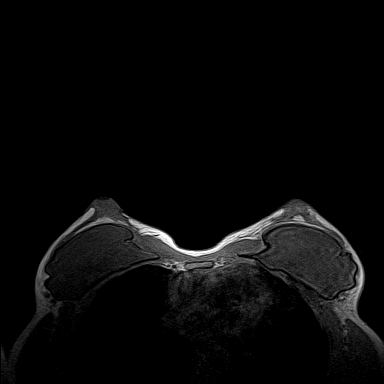
[im 108/144]
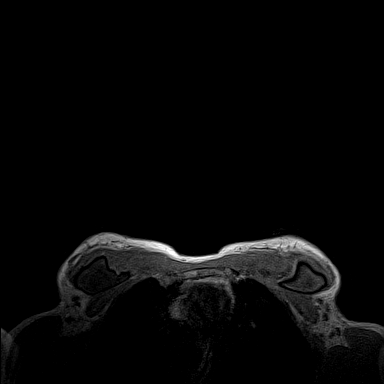
[im 144/144]
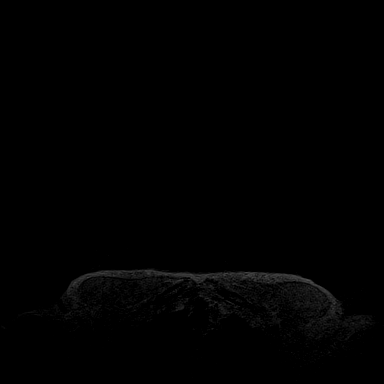

[Series 4: fl3d pre-cm · axial · non-contrast · 1.2mm · 0.94mm/px · z∈[-96,+76]mm · 5 of 144 slices shown]
[im 1/144]
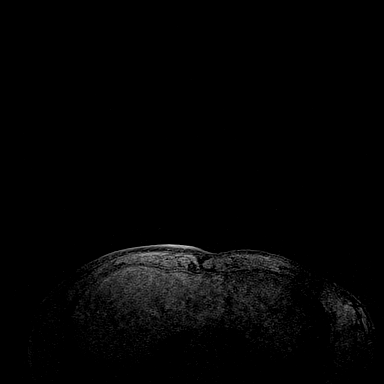
[im 36/144]
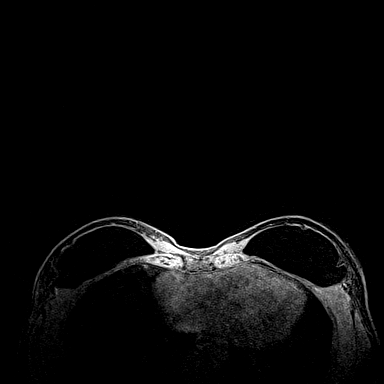
[im 72/144]
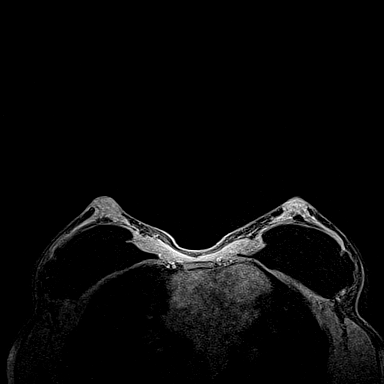
[im 108/144]
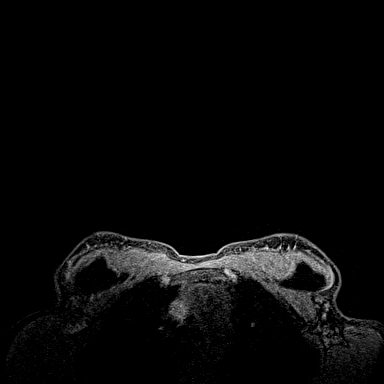
[im 144/144]
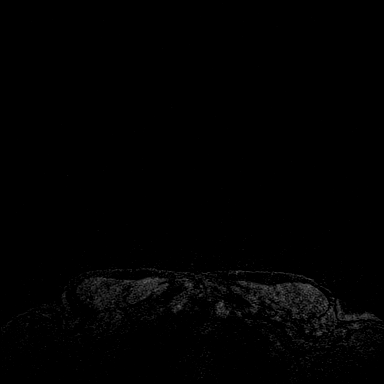

[Series 5: fl3d post-cm 20 · axial · 1.2mm · 0.94mm/px · z∈[-96,+76]mm · 5 of 144 slices shown (1 of 3)]
[im 1/144]
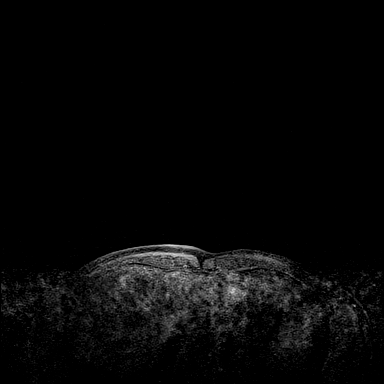
[im 36/144]
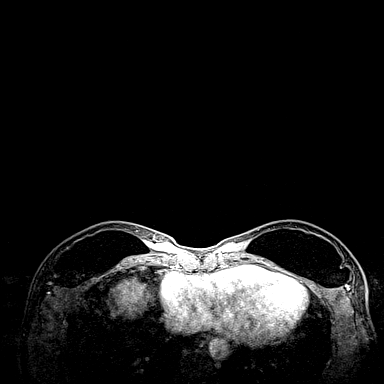
[im 72/144]
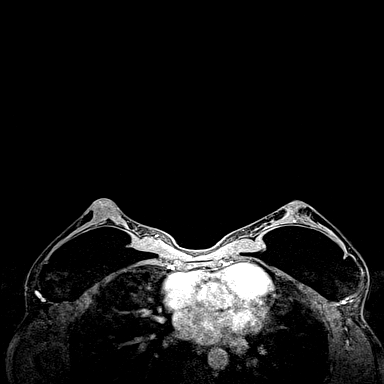
[im 108/144]
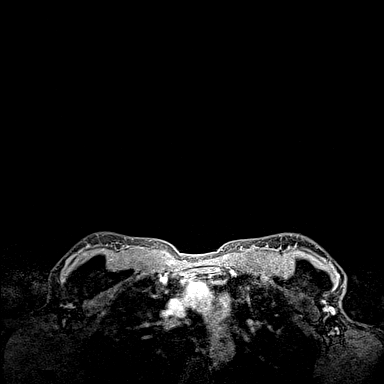
[im 144/144]
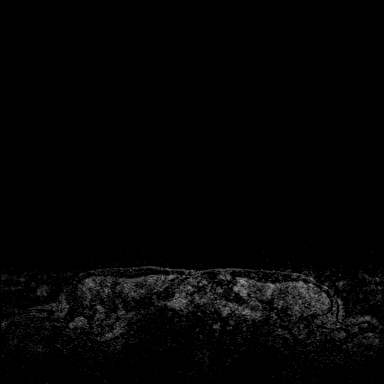

[Series 6: fl3d post-cm 20 · axial · 1.2mm · 0.94mm/px · z∈[-96,+76]mm · 5 of 144 slices shown (2 of 3)]
[im 1/144]
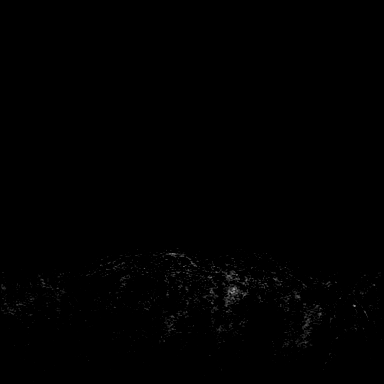
[im 36/144]
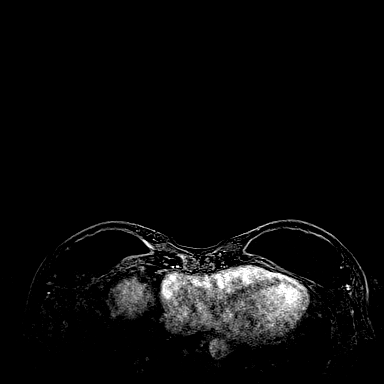
[im 72/144]
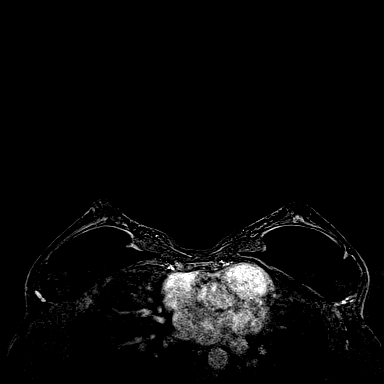
[im 108/144]
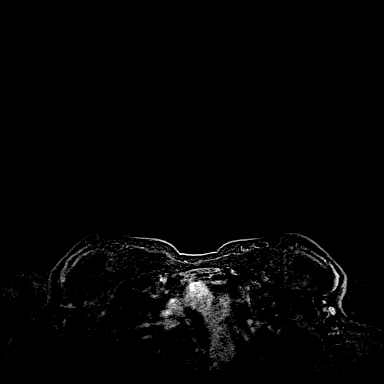
[im 144/144]
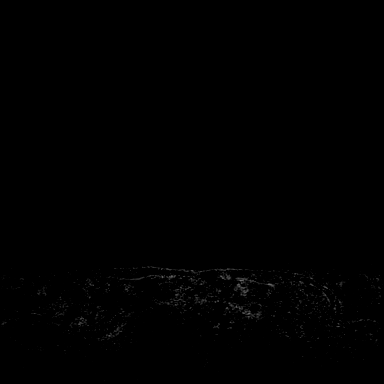

[Series 7: fl3d post-cm 20 · axial · 172.8mm · 0.94mm/px · 1 of 1 slices shown (3 of 3)]
[im 1/1]
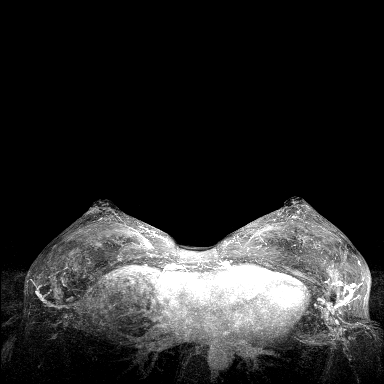

[Series 8: fl3d post-cm 3min · axial · 1.2mm · 0.94mm/px · z∈[-96,+76]mm · 6 of 144 slices shown]
[im 1/144]
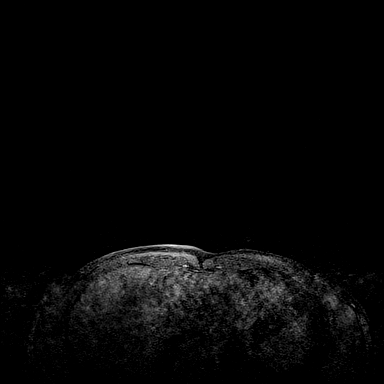
[im 29/144]
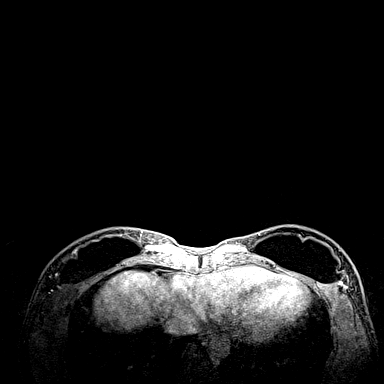
[im 58/144]
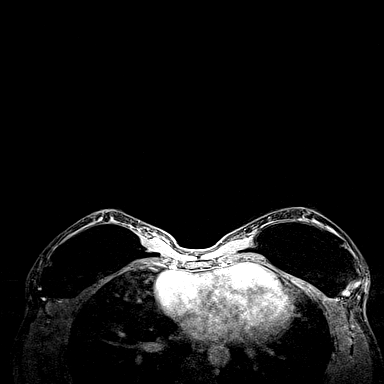
[im 86/144]
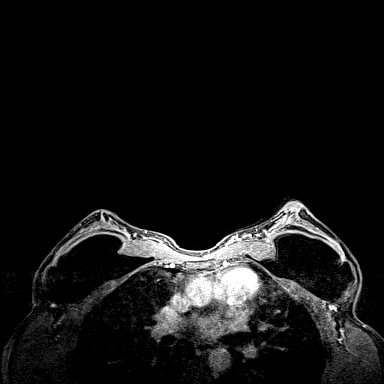
[im 115/144]
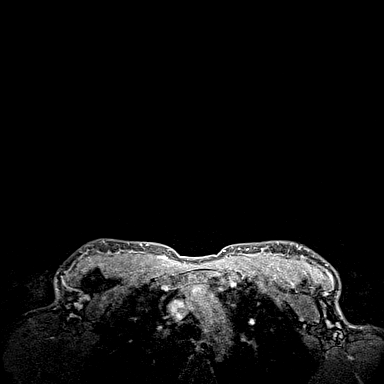
[im 144/144]
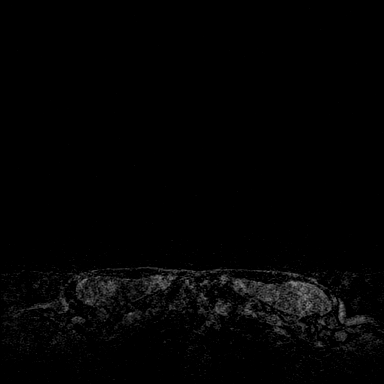

[Series 9: fl3d post-cm 3min_sub · axial · 1.2mm · 0.94mm/px · z∈[-96,+41]mm · 5 of 144 slices shown]
[im 1/144]
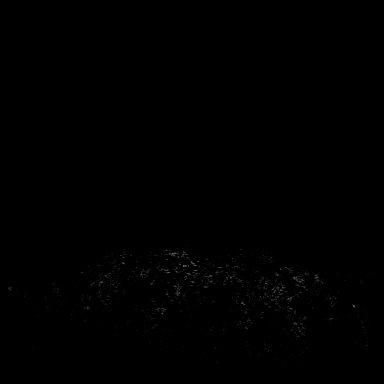
[im 29/144]
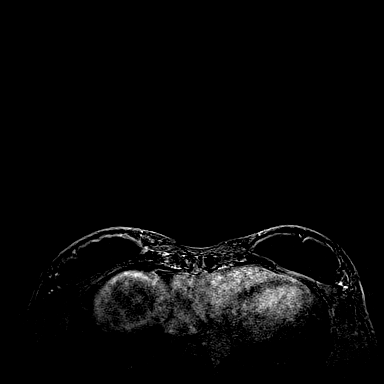
[im 58/144]
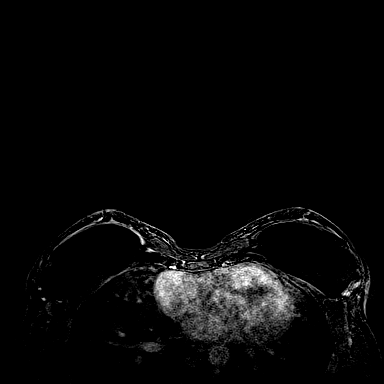
[im 86/144]
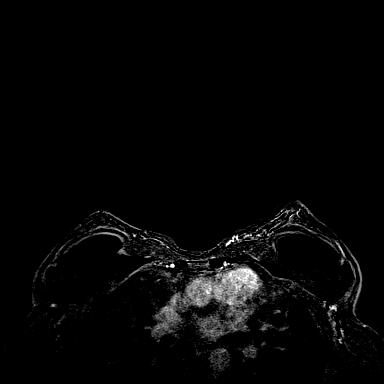
[im 115/144]
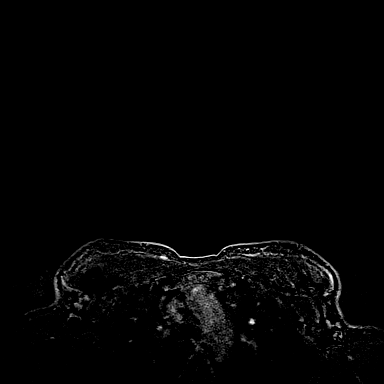

[33 of 48 positions shown; findings below may reference images not displayed]

Three-dimensional MR images were rendered by post-processing of the
original MR data on an independent workstation. The
three-dimensional MR images were interpreted, and findings are
reported in the following complete MRI report for this study. Three
dimensional images were evaluated at the independent DynaCad
workstation
FINDINGS: Breast composition: c. Heterogeneous fibroglandular tissue.

Background parenchymal enhancement: Minimal

Right breast: No mass or abnormal enhancement. Retropectoral
silicone implant.

Left breast: No mass or abnormal enhancement. Retropectoral silicone
implant.

Lymph nodes: No abnormal appearing lymph nodes.

Ancillary findings: A 4 millimeter T2 bright lesion is identified at
the dome of the liver, likely representing benign hemangioma or
cyst.
IMPRESSION: No MRI evidence for malignancy in either breast.

Bilateral retropectoral silicone implants.

RECOMMENDATION:
1. Recommend screening mammogram in [DATE].
2. Based on the recommendations of the American Cancer Society,
annual screening MRI is suggested in addition to annual mammography
if the patient has an estimated lifetime risk of developing breast
cancer which is greater than 20%.

BI-RADS CATEGORY  1: Negative.

## 2019-03-07 MED ORDER — GADOBUTROL 1 MMOL/ML IV SOLN
6.0000 mL | Freq: Once | INTRAVENOUS | Status: AC | PRN
  Administered 2019-03-07: 6 mL via INTRAVENOUS

## 2019-04-03 DIAGNOSIS — Z1159 Encounter for screening for other viral diseases: Secondary | ICD-10-CM | POA: Diagnosis not present

## 2019-04-05 DIAGNOSIS — K635 Polyp of colon: Secondary | ICD-10-CM | POA: Diagnosis not present

## 2019-04-05 DIAGNOSIS — K6389 Other specified diseases of intestine: Secondary | ICD-10-CM | POA: Diagnosis not present

## 2019-04-05 DIAGNOSIS — Z1211 Encounter for screening for malignant neoplasm of colon: Secondary | ICD-10-CM | POA: Diagnosis not present

## 2019-05-17 DIAGNOSIS — Z9189 Other specified personal risk factors, not elsewhere classified: Secondary | ICD-10-CM | POA: Diagnosis not present

## 2019-06-20 DIAGNOSIS — M25512 Pain in left shoulder: Secondary | ICD-10-CM | POA: Diagnosis not present

## 2019-06-26 DIAGNOSIS — Z23 Encounter for immunization: Secondary | ICD-10-CM | POA: Diagnosis not present

## 2019-07-07 DIAGNOSIS — Z1151 Encounter for screening for human papillomavirus (HPV): Secondary | ICD-10-CM | POA: Diagnosis not present

## 2019-07-07 DIAGNOSIS — Z01419 Encounter for gynecological examination (general) (routine) without abnormal findings: Secondary | ICD-10-CM | POA: Diagnosis not present

## 2019-07-07 DIAGNOSIS — Z6824 Body mass index (BMI) 24.0-24.9, adult: Secondary | ICD-10-CM | POA: Diagnosis not present

## 2019-07-14 DIAGNOSIS — Z1231 Encounter for screening mammogram for malignant neoplasm of breast: Secondary | ICD-10-CM | POA: Diagnosis not present

## 2019-07-17 DIAGNOSIS — Z23 Encounter for immunization: Secondary | ICD-10-CM | POA: Diagnosis not present

## 2019-07-24 DIAGNOSIS — D251 Intramural leiomyoma of uterus: Secondary | ICD-10-CM | POA: Diagnosis not present

## 2019-07-24 DIAGNOSIS — Z9189 Other specified personal risk factors, not elsewhere classified: Secondary | ICD-10-CM | POA: Diagnosis not present

## 2019-07-24 DIAGNOSIS — D252 Subserosal leiomyoma of uterus: Secondary | ICD-10-CM | POA: Diagnosis not present

## 2019-11-17 DIAGNOSIS — M25512 Pain in left shoulder: Secondary | ICD-10-CM | POA: Diagnosis not present

## 2019-11-25 DIAGNOSIS — M25512 Pain in left shoulder: Secondary | ICD-10-CM | POA: Diagnosis not present

## 2019-11-28 DIAGNOSIS — M25512 Pain in left shoulder: Secondary | ICD-10-CM | POA: Diagnosis not present

## 2019-11-29 DIAGNOSIS — M25512 Pain in left shoulder: Secondary | ICD-10-CM | POA: Diagnosis not present

## 2019-12-18 DIAGNOSIS — M25612 Stiffness of left shoulder, not elsewhere classified: Secondary | ICD-10-CM | POA: Diagnosis not present

## 2019-12-18 DIAGNOSIS — M25512 Pain in left shoulder: Secondary | ICD-10-CM | POA: Diagnosis not present

## 2019-12-18 DIAGNOSIS — M6281 Muscle weakness (generalized): Secondary | ICD-10-CM | POA: Diagnosis not present

## 2019-12-18 DIAGNOSIS — Z9189 Other specified personal risk factors, not elsewhere classified: Secondary | ICD-10-CM | POA: Diagnosis not present

## 2019-12-18 DIAGNOSIS — M75112 Incomplete rotator cuff tear or rupture of left shoulder, not specified as traumatic: Secondary | ICD-10-CM | POA: Diagnosis not present

## 2020-01-05 DIAGNOSIS — M25612 Stiffness of left shoulder, not elsewhere classified: Secondary | ICD-10-CM | POA: Diagnosis not present

## 2020-01-05 DIAGNOSIS — M75112 Incomplete rotator cuff tear or rupture of left shoulder, not specified as traumatic: Secondary | ICD-10-CM | POA: Diagnosis not present

## 2020-01-05 DIAGNOSIS — M6281 Muscle weakness (generalized): Secondary | ICD-10-CM | POA: Diagnosis not present

## 2020-01-05 DIAGNOSIS — M25512 Pain in left shoulder: Secondary | ICD-10-CM | POA: Diagnosis not present

## 2020-01-10 DIAGNOSIS — M25512 Pain in left shoulder: Secondary | ICD-10-CM | POA: Diagnosis not present

## 2020-01-10 DIAGNOSIS — M25612 Stiffness of left shoulder, not elsewhere classified: Secondary | ICD-10-CM | POA: Diagnosis not present

## 2020-01-10 DIAGNOSIS — M6281 Muscle weakness (generalized): Secondary | ICD-10-CM | POA: Diagnosis not present

## 2020-01-10 DIAGNOSIS — M75112 Incomplete rotator cuff tear or rupture of left shoulder, not specified as traumatic: Secondary | ICD-10-CM | POA: Diagnosis not present

## 2020-01-16 DIAGNOSIS — M6281 Muscle weakness (generalized): Secondary | ICD-10-CM | POA: Diagnosis not present

## 2020-01-16 DIAGNOSIS — M75112 Incomplete rotator cuff tear or rupture of left shoulder, not specified as traumatic: Secondary | ICD-10-CM | POA: Diagnosis not present

## 2020-01-16 DIAGNOSIS — M25612 Stiffness of left shoulder, not elsewhere classified: Secondary | ICD-10-CM | POA: Diagnosis not present

## 2020-01-16 DIAGNOSIS — M25512 Pain in left shoulder: Secondary | ICD-10-CM | POA: Diagnosis not present

## 2020-01-24 DIAGNOSIS — M25561 Pain in right knee: Secondary | ICD-10-CM | POA: Diagnosis not present

## 2020-01-24 DIAGNOSIS — R262 Difficulty in walking, not elsewhere classified: Secondary | ICD-10-CM | POA: Diagnosis not present

## 2020-01-24 DIAGNOSIS — M25612 Stiffness of left shoulder, not elsewhere classified: Secondary | ICD-10-CM | POA: Diagnosis not present

## 2020-01-24 DIAGNOSIS — M6281 Muscle weakness (generalized): Secondary | ICD-10-CM | POA: Diagnosis not present

## 2020-01-25 ENCOUNTER — Encounter (HOSPITAL_BASED_OUTPATIENT_CLINIC_OR_DEPARTMENT_OTHER): Payer: Self-pay

## 2020-01-25 ENCOUNTER — Ambulatory Visit (HOSPITAL_BASED_OUTPATIENT_CLINIC_OR_DEPARTMENT_OTHER): Admit: 2020-01-25 | Payer: Federal, State, Local not specified - PPO | Admitting: Orthopaedic Surgery

## 2020-01-25 SURGERY — SHOULDER ARTHROSCOPY WITH SUBACROMIAL DECOMPRESSION, ROTATOR CUFF REPAIR AND BICEP TENDON REPAIR
Anesthesia: Choice | Laterality: Left

## 2020-01-31 DIAGNOSIS — M25512 Pain in left shoulder: Secondary | ICD-10-CM | POA: Diagnosis not present

## 2020-01-31 DIAGNOSIS — M6281 Muscle weakness (generalized): Secondary | ICD-10-CM | POA: Diagnosis not present

## 2020-01-31 DIAGNOSIS — M75112 Incomplete rotator cuff tear or rupture of left shoulder, not specified as traumatic: Secondary | ICD-10-CM | POA: Diagnosis not present

## 2020-01-31 DIAGNOSIS — M25612 Stiffness of left shoulder, not elsewhere classified: Secondary | ICD-10-CM | POA: Diagnosis not present

## 2020-02-02 DIAGNOSIS — M25512 Pain in left shoulder: Secondary | ICD-10-CM | POA: Diagnosis not present

## 2020-02-19 DIAGNOSIS — G8918 Other acute postprocedural pain: Secondary | ICD-10-CM | POA: Diagnosis not present

## 2020-02-19 DIAGNOSIS — M7502 Adhesive capsulitis of left shoulder: Secondary | ICD-10-CM | POA: Diagnosis not present

## 2020-02-19 DIAGNOSIS — Y999 Unspecified external cause status: Secondary | ICD-10-CM | POA: Diagnosis not present

## 2020-02-19 DIAGNOSIS — X58XXXA Exposure to other specified factors, initial encounter: Secondary | ICD-10-CM | POA: Diagnosis not present

## 2020-02-19 DIAGNOSIS — S46012A Strain of muscle(s) and tendon(s) of the rotator cuff of left shoulder, initial encounter: Secondary | ICD-10-CM | POA: Diagnosis not present

## 2020-02-19 DIAGNOSIS — M7542 Impingement syndrome of left shoulder: Secondary | ICD-10-CM | POA: Diagnosis not present

## 2020-02-19 DIAGNOSIS — M24112 Other articular cartilage disorders, left shoulder: Secondary | ICD-10-CM | POA: Diagnosis not present

## 2020-02-26 DIAGNOSIS — M7502 Adhesive capsulitis of left shoulder: Secondary | ICD-10-CM | POA: Diagnosis not present

## 2020-02-26 DIAGNOSIS — M25512 Pain in left shoulder: Secondary | ICD-10-CM | POA: Diagnosis not present

## 2020-02-26 DIAGNOSIS — M25612 Stiffness of left shoulder, not elsewhere classified: Secondary | ICD-10-CM | POA: Diagnosis not present

## 2020-02-26 DIAGNOSIS — M6281 Muscle weakness (generalized): Secondary | ICD-10-CM | POA: Diagnosis not present

## 2020-02-27 DIAGNOSIS — M24112 Other articular cartilage disorders, left shoulder: Secondary | ICD-10-CM | POA: Diagnosis not present

## 2020-02-28 DIAGNOSIS — M25512 Pain in left shoulder: Secondary | ICD-10-CM | POA: Diagnosis not present

## 2020-02-28 DIAGNOSIS — M25612 Stiffness of left shoulder, not elsewhere classified: Secondary | ICD-10-CM | POA: Diagnosis not present

## 2020-02-28 DIAGNOSIS — M7502 Adhesive capsulitis of left shoulder: Secondary | ICD-10-CM | POA: Diagnosis not present

## 2020-02-28 DIAGNOSIS — M6281 Muscle weakness (generalized): Secondary | ICD-10-CM | POA: Diagnosis not present

## 2020-02-29 ENCOUNTER — Other Ambulatory Visit: Payer: Self-pay | Admitting: General Surgery

## 2020-02-29 DIAGNOSIS — Z9189 Other specified personal risk factors, not elsewhere classified: Secondary | ICD-10-CM

## 2020-03-05 DIAGNOSIS — M7502 Adhesive capsulitis of left shoulder: Secondary | ICD-10-CM | POA: Diagnosis not present

## 2020-03-05 DIAGNOSIS — M25612 Stiffness of left shoulder, not elsewhere classified: Secondary | ICD-10-CM | POA: Diagnosis not present

## 2020-03-05 DIAGNOSIS — M6281 Muscle weakness (generalized): Secondary | ICD-10-CM | POA: Diagnosis not present

## 2020-03-05 DIAGNOSIS — M25512 Pain in left shoulder: Secondary | ICD-10-CM | POA: Diagnosis not present

## 2020-03-07 DIAGNOSIS — M25512 Pain in left shoulder: Secondary | ICD-10-CM | POA: Diagnosis not present

## 2020-03-07 DIAGNOSIS — M6281 Muscle weakness (generalized): Secondary | ICD-10-CM | POA: Diagnosis not present

## 2020-03-07 DIAGNOSIS — M25612 Stiffness of left shoulder, not elsewhere classified: Secondary | ICD-10-CM | POA: Diagnosis not present

## 2020-03-07 DIAGNOSIS — M7502 Adhesive capsulitis of left shoulder: Secondary | ICD-10-CM | POA: Diagnosis not present

## 2020-03-13 DIAGNOSIS — M7502 Adhesive capsulitis of left shoulder: Secondary | ICD-10-CM | POA: Diagnosis not present

## 2020-03-13 DIAGNOSIS — M25512 Pain in left shoulder: Secondary | ICD-10-CM | POA: Diagnosis not present

## 2020-03-13 DIAGNOSIS — M6281 Muscle weakness (generalized): Secondary | ICD-10-CM | POA: Diagnosis not present

## 2020-03-13 DIAGNOSIS — M25612 Stiffness of left shoulder, not elsewhere classified: Secondary | ICD-10-CM | POA: Diagnosis not present

## 2020-03-15 DIAGNOSIS — M7502 Adhesive capsulitis of left shoulder: Secondary | ICD-10-CM | POA: Diagnosis not present

## 2020-03-15 DIAGNOSIS — M6281 Muscle weakness (generalized): Secondary | ICD-10-CM | POA: Diagnosis not present

## 2020-03-15 DIAGNOSIS — M25512 Pain in left shoulder: Secondary | ICD-10-CM | POA: Diagnosis not present

## 2020-03-15 DIAGNOSIS — M25612 Stiffness of left shoulder, not elsewhere classified: Secondary | ICD-10-CM | POA: Diagnosis not present

## 2020-03-19 DIAGNOSIS — M6281 Muscle weakness (generalized): Secondary | ICD-10-CM | POA: Diagnosis not present

## 2020-03-19 DIAGNOSIS — K080 Exfoliation of teeth due to systemic causes: Secondary | ICD-10-CM | POA: Diagnosis not present

## 2020-03-19 DIAGNOSIS — M7502 Adhesive capsulitis of left shoulder: Secondary | ICD-10-CM | POA: Diagnosis not present

## 2020-03-19 DIAGNOSIS — M25512 Pain in left shoulder: Secondary | ICD-10-CM | POA: Diagnosis not present

## 2020-03-19 DIAGNOSIS — M25612 Stiffness of left shoulder, not elsewhere classified: Secondary | ICD-10-CM | POA: Diagnosis not present

## 2020-03-20 DIAGNOSIS — M25612 Stiffness of left shoulder, not elsewhere classified: Secondary | ICD-10-CM | POA: Diagnosis not present

## 2020-03-20 DIAGNOSIS — M6281 Muscle weakness (generalized): Secondary | ICD-10-CM | POA: Diagnosis not present

## 2020-03-20 DIAGNOSIS — M25512 Pain in left shoulder: Secondary | ICD-10-CM | POA: Diagnosis not present

## 2020-03-20 DIAGNOSIS — M7502 Adhesive capsulitis of left shoulder: Secondary | ICD-10-CM | POA: Diagnosis not present

## 2020-03-27 DIAGNOSIS — M6281 Muscle weakness (generalized): Secondary | ICD-10-CM | POA: Diagnosis not present

## 2020-03-27 DIAGNOSIS — M7502 Adhesive capsulitis of left shoulder: Secondary | ICD-10-CM | POA: Diagnosis not present

## 2020-03-27 DIAGNOSIS — M25612 Stiffness of left shoulder, not elsewhere classified: Secondary | ICD-10-CM | POA: Diagnosis not present

## 2020-03-27 DIAGNOSIS — M25512 Pain in left shoulder: Secondary | ICD-10-CM | POA: Diagnosis not present

## 2020-04-01 ENCOUNTER — Ambulatory Visit
Admission: RE | Admit: 2020-04-01 | Discharge: 2020-04-01 | Disposition: A | Discharge status: Home / Self Care | Payer: Federal, State, Local not specified - PPO | Source: Ambulatory Visit | Attending: General Surgery | Admitting: General Surgery

## 2020-04-01 ENCOUNTER — Other Ambulatory Visit: Payer: Self-pay

## 2020-04-01 DIAGNOSIS — Z9882 Breast implant status: Secondary | ICD-10-CM | POA: Diagnosis not present

## 2020-04-01 DIAGNOSIS — Z803 Family history of malignant neoplasm of breast: Secondary | ICD-10-CM | POA: Diagnosis not present

## 2020-04-01 DIAGNOSIS — Z9189 Other specified personal risk factors, not elsewhere classified: Secondary | ICD-10-CM

## 2020-04-01 IMAGING — MR MR BREAST BILAT WO/W CM
8 of 12 series · 33 of 48 positions shown · IV contrast (6ml Gadavist)
Comparison: Multiple prior exams, most recently screening mammogram
dated [DATE] and breast MR dated [DATE].

CLINICAL DATA: High risk screening breast MR. Family history of
breast cancer. Patient has a history of bilateral silicone breast
implants in [PP].

LABS:  None.
EXAM:
BILATERAL BREAST MRI WITH AND WITHOUT CONTRAST
TECHNIQUE: Multiplanar, multisequence MR images of both breasts were obtained
prior to and following the intravenous administration of 6 ml of
Gadavist

[Series 2: t2_tirm_tra ipat (a-p) · axial · 3.0mm · 0.70mm/px · 1 of 56 slices shown]
[im 1/56]
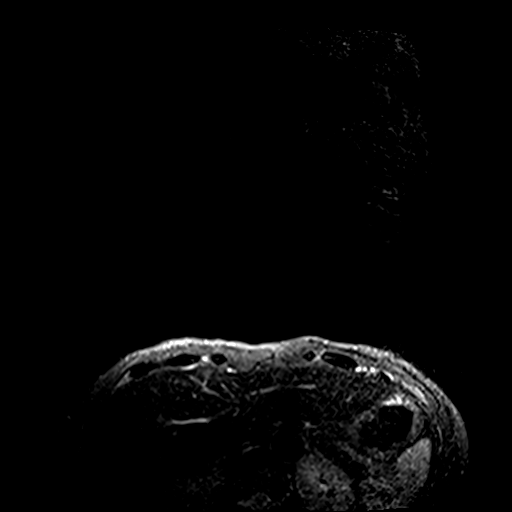

[Series 3: fl3d pre-cm no · axial · non-contrast · 1.2mm · 0.94mm/px · z∈[-118,+73]mm · 5 of 160 slices shown]
[im 1/160]
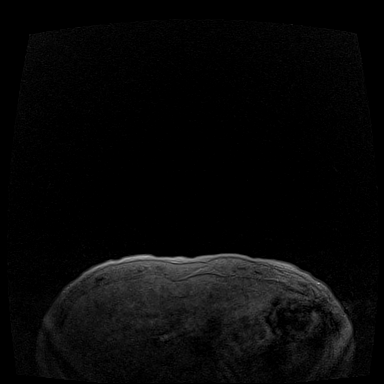
[im 40/160]
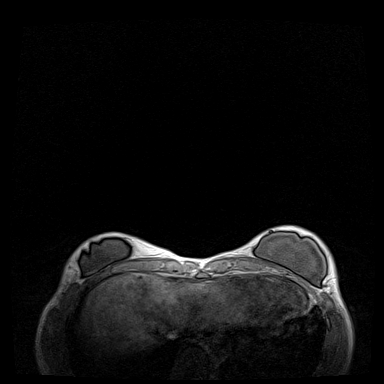
[im 80/160]
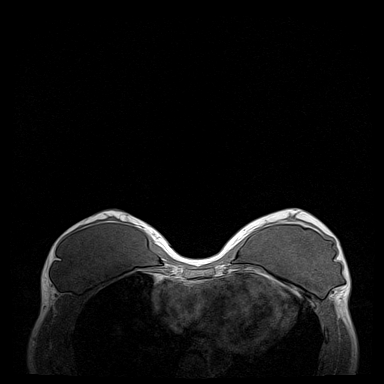
[im 120/160]
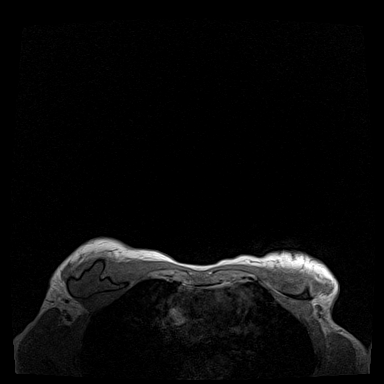
[im 160/160]
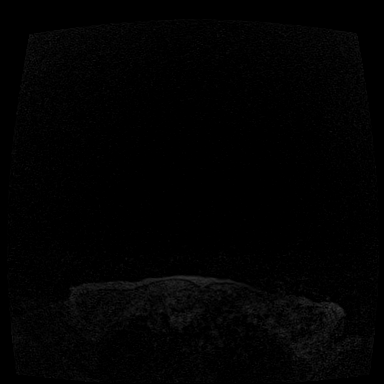

[Series 4: fl3d pre-cm · axial · non-contrast · 1.2mm · 0.94mm/px · z∈[-118,+73]mm · 5 of 160 slices shown]
[im 1/160]
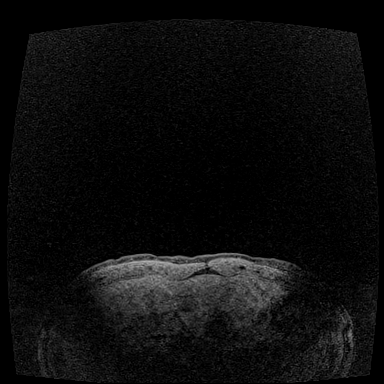
[im 40/160]
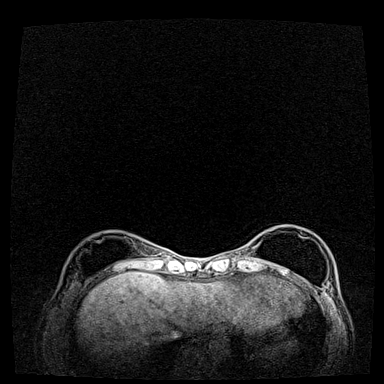
[im 80/160]
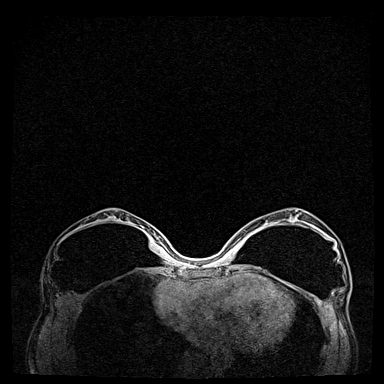
[im 120/160]
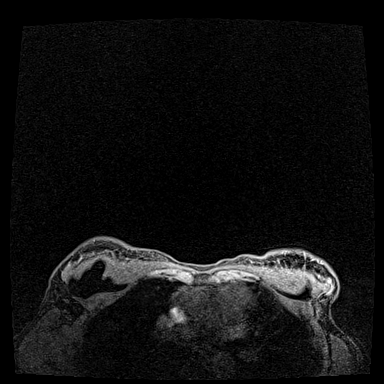
[im 160/160]
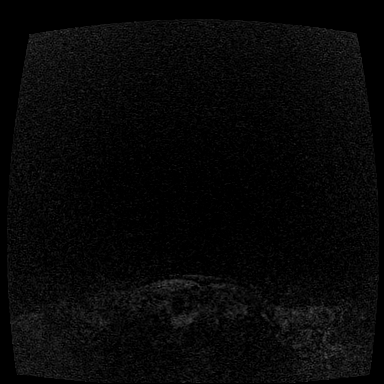

[Series 5: fl3d post-cm 20 · axial · 1.2mm · 0.94mm/px · z∈[-118,+73]mm · 5 of 160 slices shown (1 of 3)]
[im 1/160]
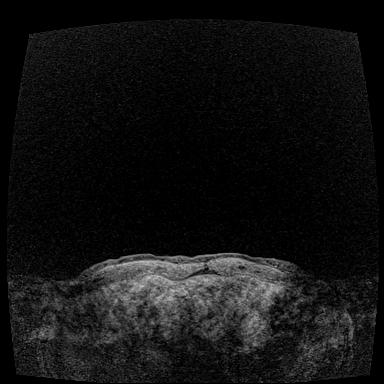
[im 40/160]
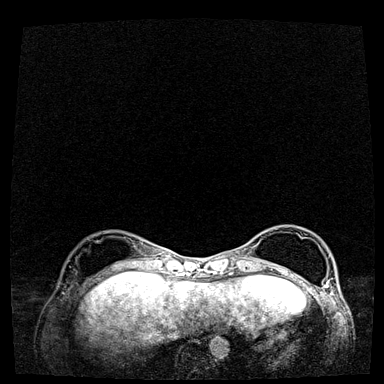
[im 80/160]
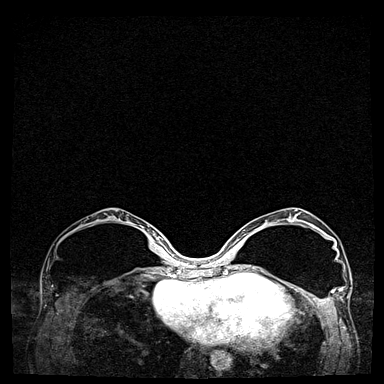
[im 120/160]
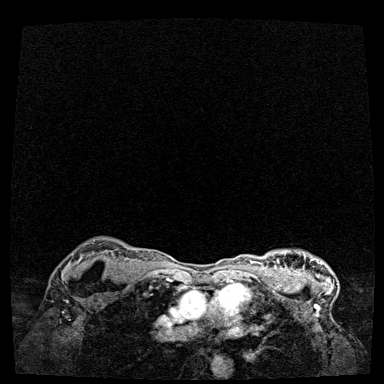
[im 160/160]
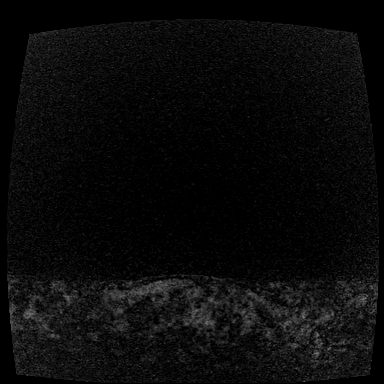

[Series 6: fl3d post-cm 20 · axial · 1.2mm · 0.94mm/px · z∈[-118,+73]mm · 5 of 160 slices shown (2 of 3)]
[im 1/160]
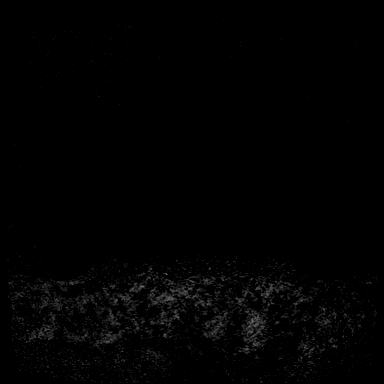
[im 40/160]
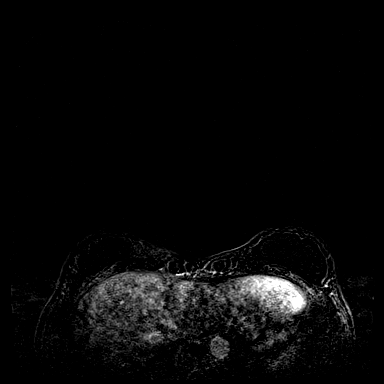
[im 80/160]
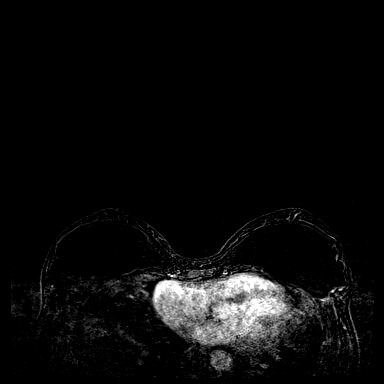
[im 120/160]
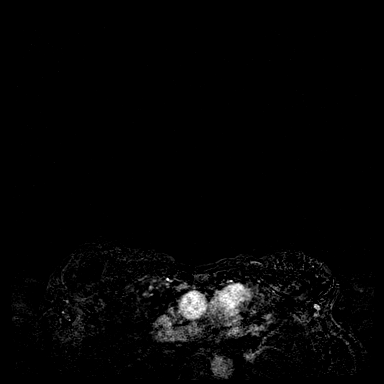
[im 160/160]
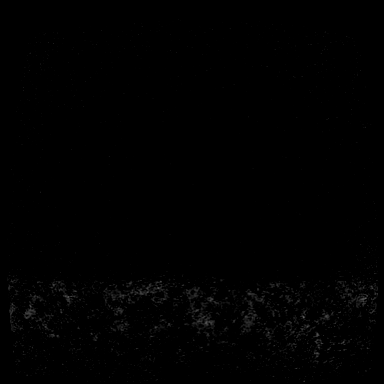

[Series 7: fl3d post-cm 20 · axial · 192.0mm · 0.94mm/px · 1 of 1 slices shown (3 of 3)]
[im 1/1]
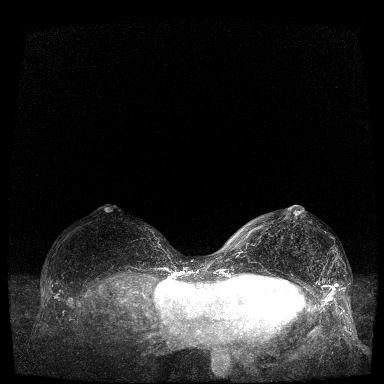

[Series 8: fl3d post-cm 3min · axial · 1.2mm · 0.94mm/px · z∈[-118,+73]mm · 6 of 160 slices shown]
[im 1/160]
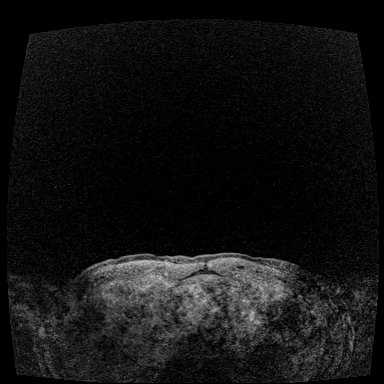
[im 32/160]
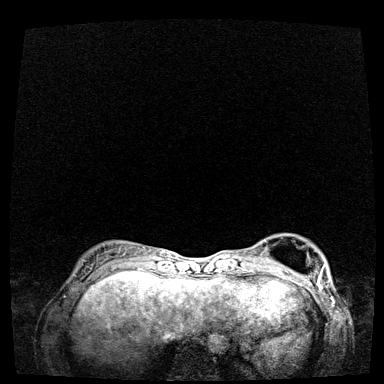
[im 64/160]
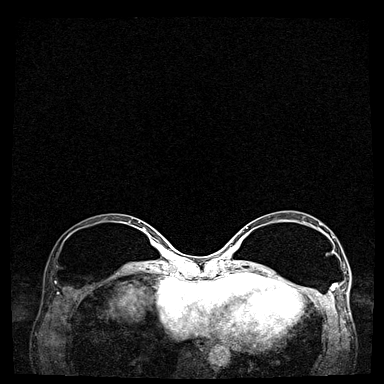
[im 96/160]
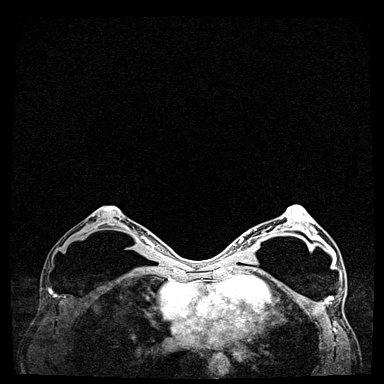
[im 128/160]
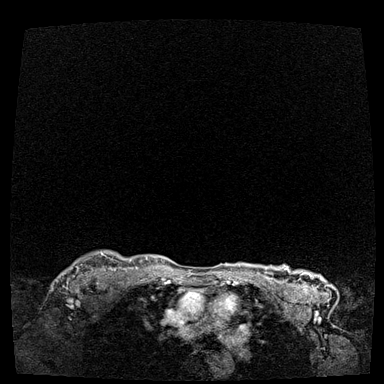
[im 160/160]
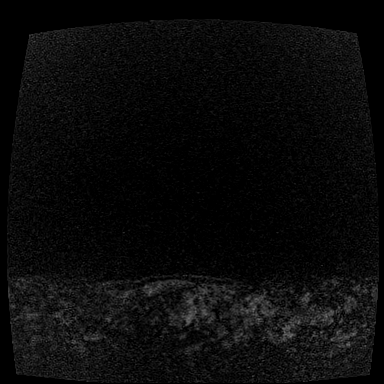

[Series 9: fl3d post-cm 3min_sub · axial · 1.2mm · 0.94mm/px · z∈[-118,+34]mm · 5 of 160 slices shown]
[im 1/160]
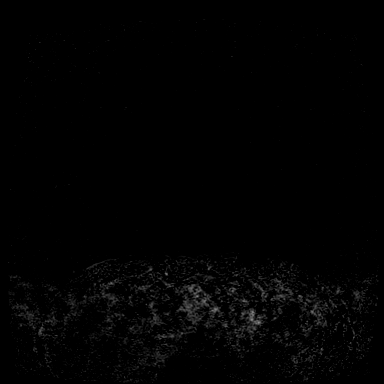
[im 32/160]
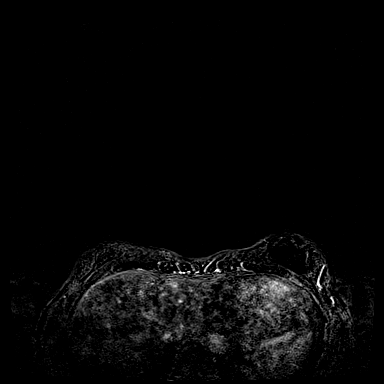
[im 64/160]
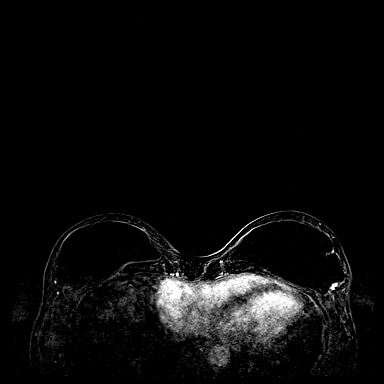
[im 96/160]
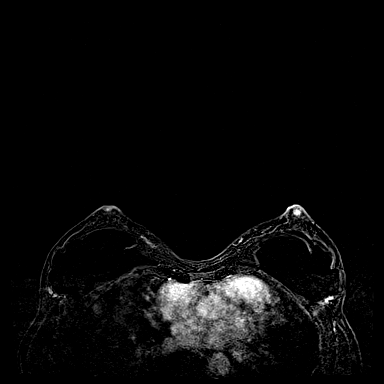
[im 128/160]
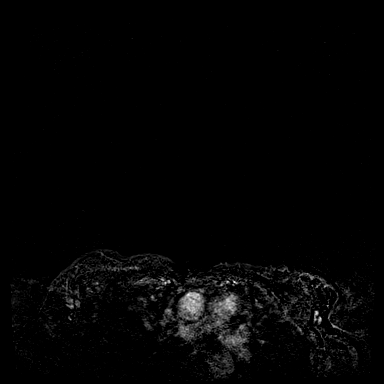

[33 of 48 positions shown; findings below may reference images not displayed]

Three-dimensional MR images were rendered by post-processing of the
original MR data on an independent workstation. The
three-dimensional MR images were interpreted, and findings are
reported in the following complete MRI report for this study. Three
dimensional images were evaluated at the independent interpreting
workstation using the DynaCAD thin client.
The patient
reportedly has had a more recent mammogram performed at an outside
institution.
FINDINGS: Breast composition: c. Heterogeneous fibroglandular tissue.

Background parenchymal enhancement: Minimal

Right breast: No mass or abnormal enhancement. A retropectoral
silicone implant is redemonstrated.

Left breast: No mass or abnormal enhancement. A retropectoral
silicone implant is redemonstrated.

Lymph nodes: No abnormal appearing lymph nodes.

Ancillary findings: A benign cyst versus hemangioma in the liver is
redemonstrated, measuring 4 mm.
IMPRESSION: No evidence of malignancy.

RECOMMENDATION:
Recommend routine annual screening mammogram. The American Cancer
Society recommends annual MRI and mammography in patients with an
estimated lifetime risk of developing breast cancer greater than 20
- 25%, or who are known or suspected to be positive for the breast
cancer gene. If supplemental screening with MRI is performed,
ideally it would be performed 6 months after the screening
mammogram.

BI-RADS CATEGORY  1: Negative.

## 2020-04-01 MED ORDER — GADOBUTROL 1 MMOL/ML IV SOLN
6.0000 mL | Freq: Once | INTRAVENOUS | Status: AC | PRN
  Administered 2020-04-01: 6 mL via INTRAVENOUS

## 2020-04-02 DIAGNOSIS — M25512 Pain in left shoulder: Secondary | ICD-10-CM | POA: Diagnosis not present

## 2020-04-02 DIAGNOSIS — M6281 Muscle weakness (generalized): Secondary | ICD-10-CM | POA: Diagnosis not present

## 2020-04-02 DIAGNOSIS — M25612 Stiffness of left shoulder, not elsewhere classified: Secondary | ICD-10-CM | POA: Diagnosis not present

## 2020-04-02 DIAGNOSIS — M7502 Adhesive capsulitis of left shoulder: Secondary | ICD-10-CM | POA: Diagnosis not present

## 2020-04-05 DIAGNOSIS — M25612 Stiffness of left shoulder, not elsewhere classified: Secondary | ICD-10-CM | POA: Diagnosis not present

## 2020-04-05 DIAGNOSIS — J3081 Allergic rhinitis due to animal (cat) (dog) hair and dander: Secondary | ICD-10-CM | POA: Diagnosis not present

## 2020-04-05 DIAGNOSIS — M7502 Adhesive capsulitis of left shoulder: Secondary | ICD-10-CM | POA: Diagnosis not present

## 2020-04-05 DIAGNOSIS — J3089 Other allergic rhinitis: Secondary | ICD-10-CM | POA: Diagnosis not present

## 2020-04-05 DIAGNOSIS — M6281 Muscle weakness (generalized): Secondary | ICD-10-CM | POA: Diagnosis not present

## 2020-04-05 DIAGNOSIS — M25512 Pain in left shoulder: Secondary | ICD-10-CM | POA: Diagnosis not present

## 2020-04-05 DIAGNOSIS — J301 Allergic rhinitis due to pollen: Secondary | ICD-10-CM | POA: Diagnosis not present

## 2020-04-10 DIAGNOSIS — M25612 Stiffness of left shoulder, not elsewhere classified: Secondary | ICD-10-CM | POA: Diagnosis not present

## 2020-04-10 DIAGNOSIS — M6281 Muscle weakness (generalized): Secondary | ICD-10-CM | POA: Diagnosis not present

## 2020-04-10 DIAGNOSIS — M7502 Adhesive capsulitis of left shoulder: Secondary | ICD-10-CM | POA: Diagnosis not present

## 2020-04-10 DIAGNOSIS — M25512 Pain in left shoulder: Secondary | ICD-10-CM | POA: Diagnosis not present

## 2020-04-12 DIAGNOSIS — M6281 Muscle weakness (generalized): Secondary | ICD-10-CM | POA: Diagnosis not present

## 2020-04-12 DIAGNOSIS — M25612 Stiffness of left shoulder, not elsewhere classified: Secondary | ICD-10-CM | POA: Diagnosis not present

## 2020-04-12 DIAGNOSIS — M7502 Adhesive capsulitis of left shoulder: Secondary | ICD-10-CM | POA: Diagnosis not present

## 2020-04-12 DIAGNOSIS — M25512 Pain in left shoulder: Secondary | ICD-10-CM | POA: Diagnosis not present

## 2020-04-17 DIAGNOSIS — M25512 Pain in left shoulder: Secondary | ICD-10-CM | POA: Diagnosis not present

## 2020-04-17 DIAGNOSIS — M6281 Muscle weakness (generalized): Secondary | ICD-10-CM | POA: Diagnosis not present

## 2020-04-17 DIAGNOSIS — M7502 Adhesive capsulitis of left shoulder: Secondary | ICD-10-CM | POA: Diagnosis not present

## 2020-04-17 DIAGNOSIS — M25612 Stiffness of left shoulder, not elsewhere classified: Secondary | ICD-10-CM | POA: Diagnosis not present

## 2020-04-19 DIAGNOSIS — M25512 Pain in left shoulder: Secondary | ICD-10-CM | POA: Diagnosis not present

## 2020-04-19 DIAGNOSIS — M7502 Adhesive capsulitis of left shoulder: Secondary | ICD-10-CM | POA: Diagnosis not present

## 2020-04-19 DIAGNOSIS — M25612 Stiffness of left shoulder, not elsewhere classified: Secondary | ICD-10-CM | POA: Diagnosis not present

## 2020-04-19 DIAGNOSIS — M6281 Muscle weakness (generalized): Secondary | ICD-10-CM | POA: Diagnosis not present

## 2020-04-22 DIAGNOSIS — M25612 Stiffness of left shoulder, not elsewhere classified: Secondary | ICD-10-CM | POA: Diagnosis not present

## 2020-04-22 DIAGNOSIS — M25512 Pain in left shoulder: Secondary | ICD-10-CM | POA: Diagnosis not present

## 2020-04-22 DIAGNOSIS — M7502 Adhesive capsulitis of left shoulder: Secondary | ICD-10-CM | POA: Diagnosis not present

## 2020-04-22 DIAGNOSIS — M6281 Muscle weakness (generalized): Secondary | ICD-10-CM | POA: Diagnosis not present

## 2020-04-24 DIAGNOSIS — M25612 Stiffness of left shoulder, not elsewhere classified: Secondary | ICD-10-CM | POA: Diagnosis not present

## 2020-04-24 DIAGNOSIS — M25512 Pain in left shoulder: Secondary | ICD-10-CM | POA: Diagnosis not present

## 2020-04-24 DIAGNOSIS — M6281 Muscle weakness (generalized): Secondary | ICD-10-CM | POA: Diagnosis not present

## 2020-04-24 DIAGNOSIS — M7502 Adhesive capsulitis of left shoulder: Secondary | ICD-10-CM | POA: Diagnosis not present

## 2020-04-29 DIAGNOSIS — M25612 Stiffness of left shoulder, not elsewhere classified: Secondary | ICD-10-CM | POA: Diagnosis not present

## 2020-04-29 DIAGNOSIS — M7502 Adhesive capsulitis of left shoulder: Secondary | ICD-10-CM | POA: Diagnosis not present

## 2020-04-29 DIAGNOSIS — M6281 Muscle weakness (generalized): Secondary | ICD-10-CM | POA: Diagnosis not present

## 2020-04-29 DIAGNOSIS — M25512 Pain in left shoulder: Secondary | ICD-10-CM | POA: Diagnosis not present

## 2020-05-01 DIAGNOSIS — M25512 Pain in left shoulder: Secondary | ICD-10-CM | POA: Diagnosis not present

## 2020-05-01 DIAGNOSIS — M25612 Stiffness of left shoulder, not elsewhere classified: Secondary | ICD-10-CM | POA: Diagnosis not present

## 2020-05-01 DIAGNOSIS — M6281 Muscle weakness (generalized): Secondary | ICD-10-CM | POA: Diagnosis not present

## 2020-05-01 DIAGNOSIS — M7502 Adhesive capsulitis of left shoulder: Secondary | ICD-10-CM | POA: Diagnosis not present

## 2020-05-06 DIAGNOSIS — M6281 Muscle weakness (generalized): Secondary | ICD-10-CM | POA: Diagnosis not present

## 2020-05-06 DIAGNOSIS — M25512 Pain in left shoulder: Secondary | ICD-10-CM | POA: Diagnosis not present

## 2020-05-06 DIAGNOSIS — M7502 Adhesive capsulitis of left shoulder: Secondary | ICD-10-CM | POA: Diagnosis not present

## 2020-05-06 DIAGNOSIS — M25612 Stiffness of left shoulder, not elsewhere classified: Secondary | ICD-10-CM | POA: Diagnosis not present

## 2020-05-10 DIAGNOSIS — Z13220 Encounter for screening for lipoid disorders: Secondary | ICD-10-CM | POA: Diagnosis not present

## 2020-05-10 DIAGNOSIS — Z0000 Encounter for general adult medical examination without abnormal findings: Secondary | ICD-10-CM | POA: Diagnosis not present

## 2020-07-19 DIAGNOSIS — Z1231 Encounter for screening mammogram for malignant neoplasm of breast: Secondary | ICD-10-CM | POA: Diagnosis not present

## 2020-10-01 DIAGNOSIS — Z20822 Contact with and (suspected) exposure to covid-19: Secondary | ICD-10-CM | POA: Diagnosis not present

## 2021-01-09 ENCOUNTER — Other Ambulatory Visit: Payer: Self-pay

## 2021-01-09 ENCOUNTER — Ambulatory Visit
Admission: RE | Admit: 2021-01-09 | Discharge: 2021-01-09 | Disposition: A | Discharge status: Home / Self Care | Payer: Federal, State, Local not specified - PPO | Source: Ambulatory Visit | Attending: Internal Medicine | Admitting: Internal Medicine

## 2021-01-09 ENCOUNTER — Other Ambulatory Visit: Payer: Self-pay | Admitting: Internal Medicine

## 2021-01-09 DIAGNOSIS — R202 Paresthesia of skin: Secondary | ICD-10-CM

## 2021-01-09 DIAGNOSIS — R200 Anesthesia of skin: Secondary | ICD-10-CM | POA: Diagnosis not present

## 2021-01-09 IMAGING — CT CT HEAD W/O CM
1 series · 16 of 30 positions shown, 20 images · non-contrast
Comparison: None.

CLINICAL DATA: Left arm tingling.

EXAM:
CT HEAD WITHOUT CONTRAST
TECHNIQUE: Contiguous axial images were obtained from the base of the skull
through the vertex without intravenous contrast.

[Series 2: head w/(date) · axial · 0.44mm/px · z∈[-198,-44]mm · 16 of 35 slices shown, 20 images]
[im 2/35  brain]
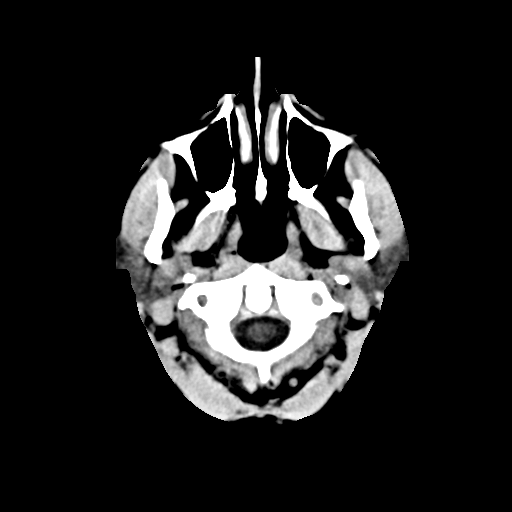
[im 2/35  bone]
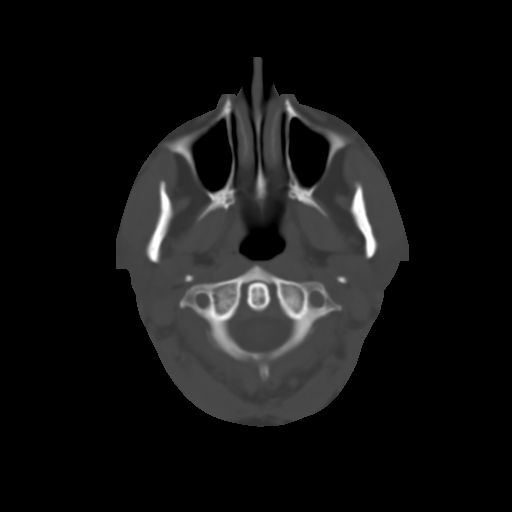
[im 4/35  brain]
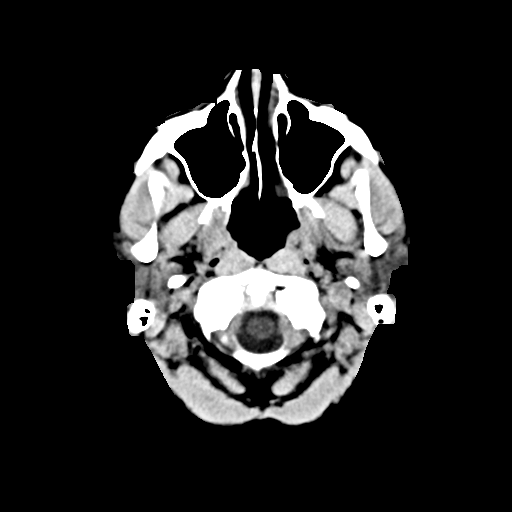
[im 6/35  brain]
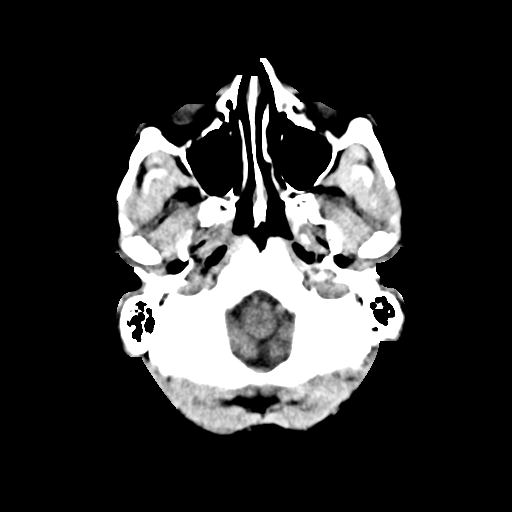
[im 9/35  brain]
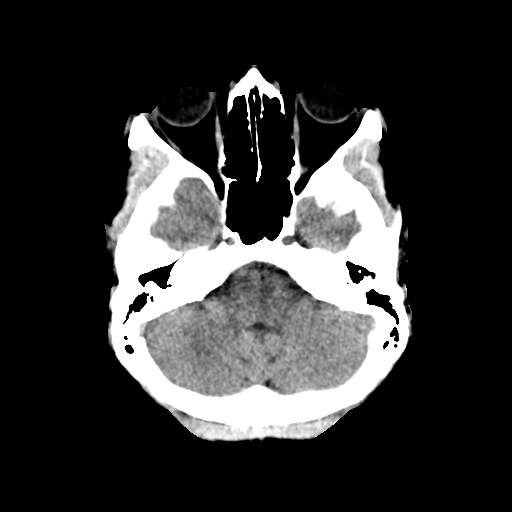
[im 10/35  brain]
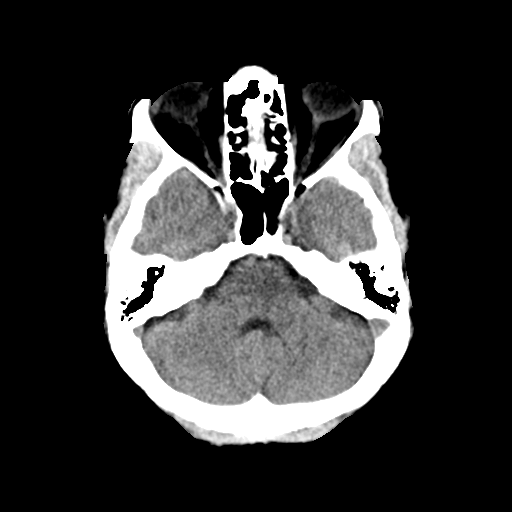
[im 10/35  bone]
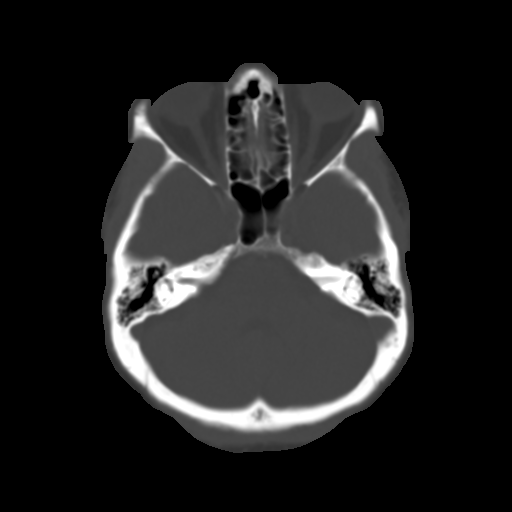
[im 12/35  brain]
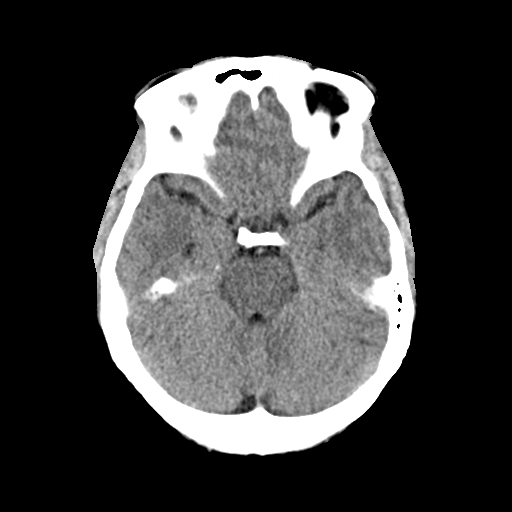
[im 15/35  brain]
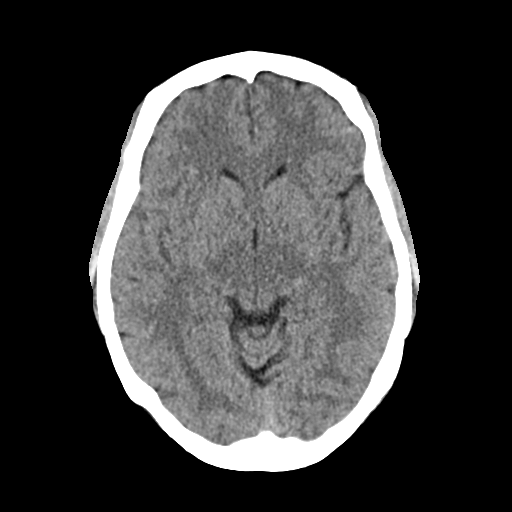
[im 17/35  brain]
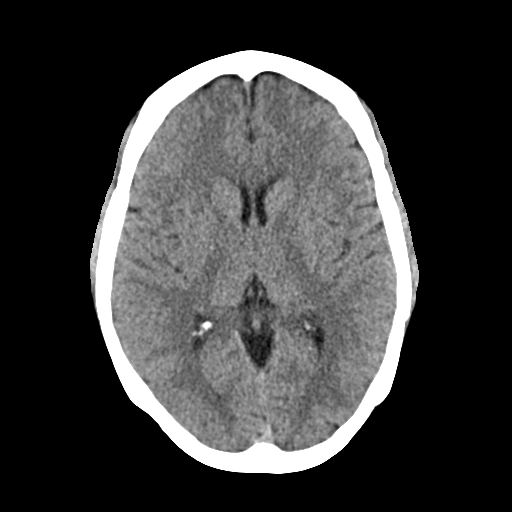
[im 18/35  brain]
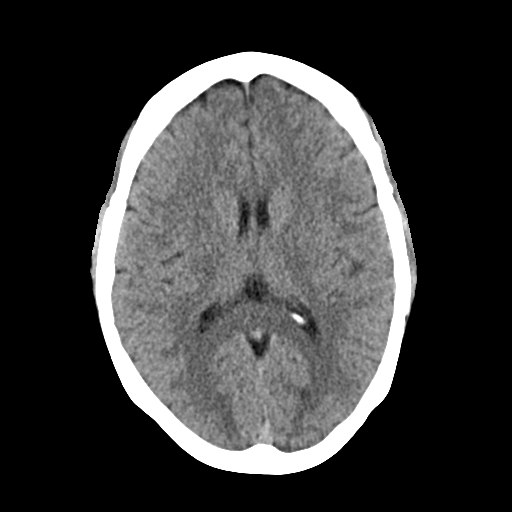
[im 18/35  bone]
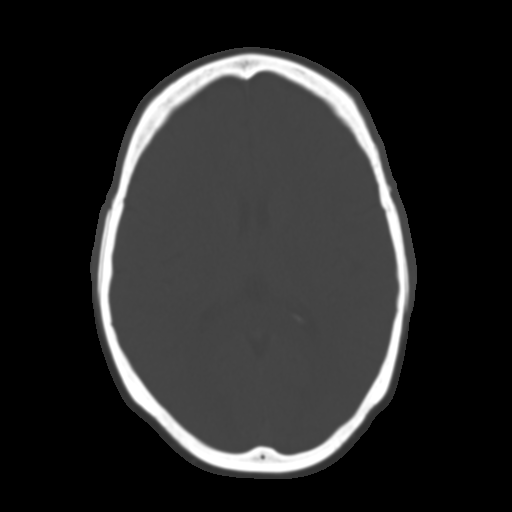
[im 20/35  brain]
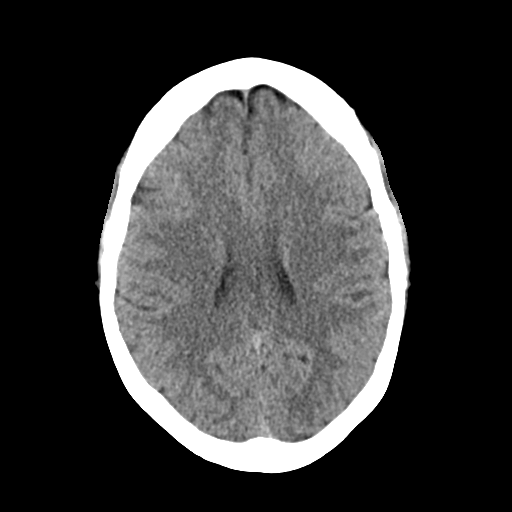
[im 23/35  brain]
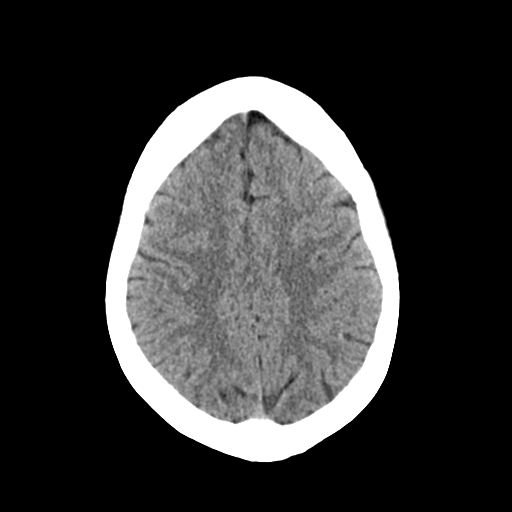
[im 25/35  brain]
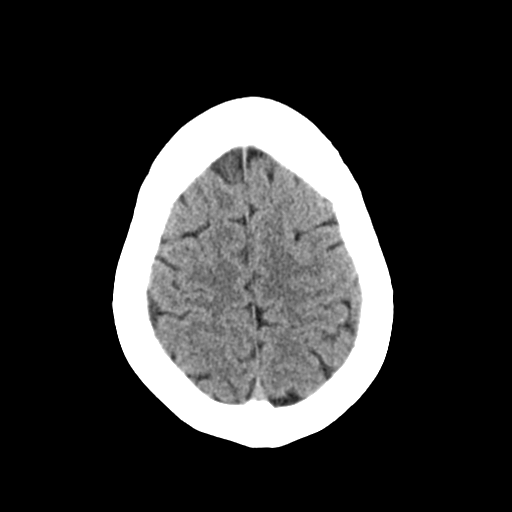
[im 26/35  brain]
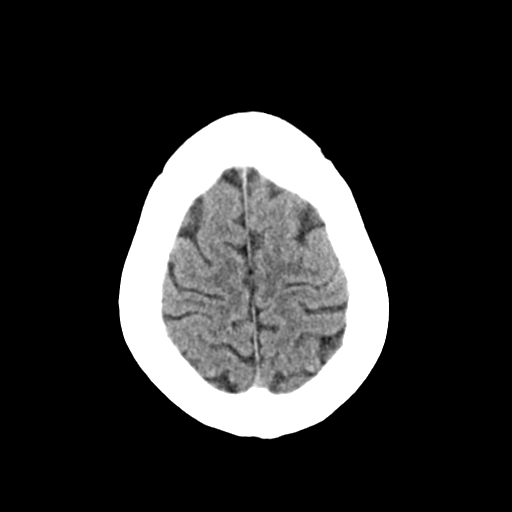
[im 26/35  bone]
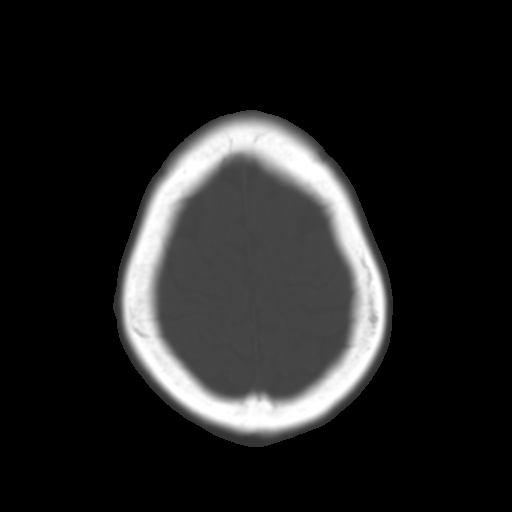
[im 29/35  brain]
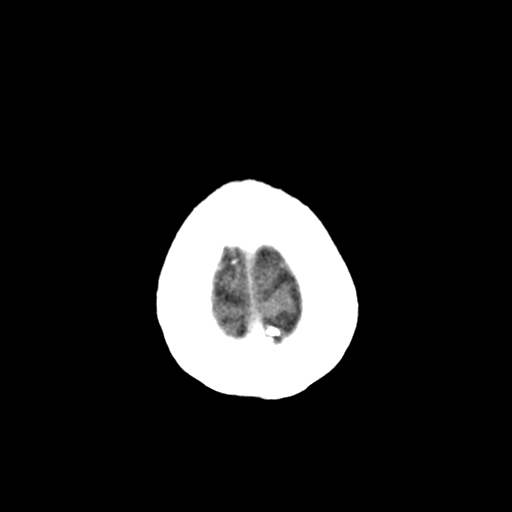
[im 31/35  brain]
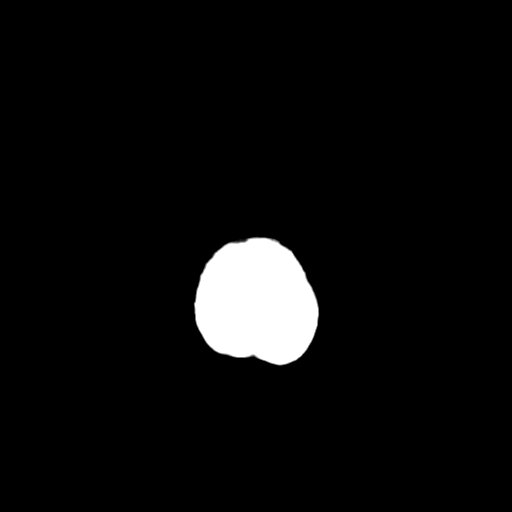
[im 33/35  brain]
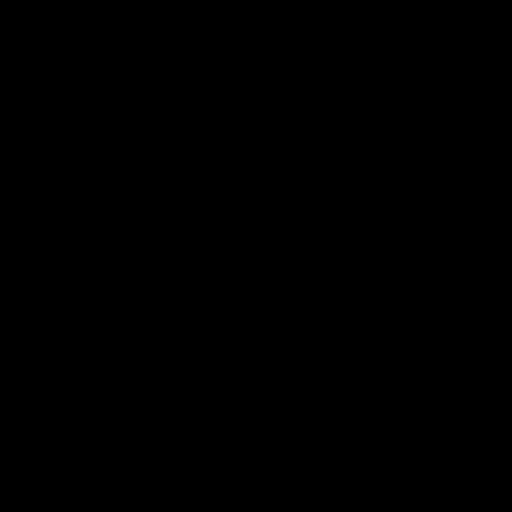

[16 of 30 positions shown; findings below may reference images not displayed]

FINDINGS: Brain: No evidence of acute infarction, hemorrhage, hydrocephalus,
extra-axial collection or mass lesion/mass effect.

Vascular: No hyperdense vessel or unexpected calcification.

Skull: Normal. Negative for fracture or focal lesion.

Sinuses/Orbits: No acute finding.

Other: None.
IMPRESSION: No acute intracranial abnormality seen.

## 2021-01-20 DIAGNOSIS — N924 Excessive bleeding in the premenopausal period: Secondary | ICD-10-CM | POA: Diagnosis not present

## 2021-01-20 DIAGNOSIS — Z01419 Encounter for gynecological examination (general) (routine) without abnormal findings: Secondary | ICD-10-CM | POA: Diagnosis not present

## 2021-01-20 DIAGNOSIS — Z79810 Long term (current) use of selective estrogen receptor modulators (SERMs): Secondary | ICD-10-CM | POA: Diagnosis not present

## 2021-01-20 DIAGNOSIS — D259 Leiomyoma of uterus, unspecified: Secondary | ICD-10-CM | POA: Diagnosis not present

## 2021-01-21 DIAGNOSIS — N924 Excessive bleeding in the premenopausal period: Secondary | ICD-10-CM | POA: Diagnosis not present

## 2021-01-21 DIAGNOSIS — Z79810 Long term (current) use of selective estrogen receptor modulators (SERMs): Secondary | ICD-10-CM | POA: Diagnosis not present

## 2021-01-21 DIAGNOSIS — D251 Intramural leiomyoma of uterus: Secondary | ICD-10-CM | POA: Diagnosis not present

## 2021-02-04 DIAGNOSIS — Z79810 Long term (current) use of selective estrogen receptor modulators (SERMs): Secondary | ICD-10-CM | POA: Diagnosis not present

## 2021-02-04 DIAGNOSIS — N924 Excessive bleeding in the premenopausal period: Secondary | ICD-10-CM | POA: Diagnosis not present

## 2021-03-19 ENCOUNTER — Other Ambulatory Visit: Payer: Self-pay | Admitting: General Surgery

## 2021-03-19 DIAGNOSIS — Z1239 Encounter for other screening for malignant neoplasm of breast: Secondary | ICD-10-CM

## 2021-03-26 DIAGNOSIS — E559 Vitamin D deficiency, unspecified: Secondary | ICD-10-CM | POA: Diagnosis not present

## 2021-04-09 ENCOUNTER — Ambulatory Visit
Admission: RE | Admit: 2021-04-09 | Discharge: 2021-04-09 | Disposition: A | Discharge status: Home / Self Care | Payer: Federal, State, Local not specified - PPO | Source: Ambulatory Visit | Attending: General Surgery | Admitting: General Surgery

## 2021-04-09 ENCOUNTER — Other Ambulatory Visit: Payer: Federal, State, Local not specified - PPO

## 2021-04-09 DIAGNOSIS — Z1239 Encounter for other screening for malignant neoplasm of breast: Secondary | ICD-10-CM

## 2021-04-09 DIAGNOSIS — Z803 Family history of malignant neoplasm of breast: Secondary | ICD-10-CM | POA: Diagnosis not present

## 2021-04-09 DIAGNOSIS — K7689 Other specified diseases of liver: Secondary | ICD-10-CM | POA: Diagnosis not present

## 2021-04-09 DIAGNOSIS — Z9882 Breast implant status: Secondary | ICD-10-CM | POA: Diagnosis not present

## 2021-04-09 IMAGING — MR MR BREAST BILAT WO/W CM
7 of 9 series · 32 of 48 positions shown · IV contrast (gadavist)
Comparison: Previous exams including breast MRIs dated [DATE],
[DATE] and [DATE].

CLINICAL DATA: Family history of breast cancer.

EXAM:
BILATERAL BREAST MRI WITH AND WITHOUT CONTRAST
TECHNIQUE: Multiplanar, multisequence MR images of both breasts were obtained
prior to and following the intravenous administration of 7 ml of
Gadavist

[Series 2: t2_tirm_tra ipat (a-p) · axial · 3.0mm · 0.70mm/px · z∈[-99,+63]mm · 2 of 55 slices shown]
[im 1/55]
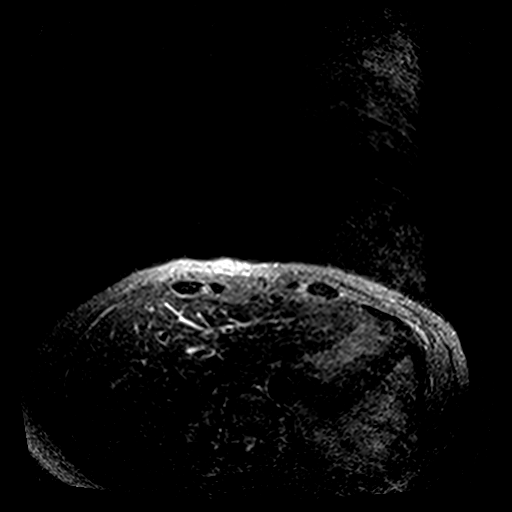
[im 55/55]
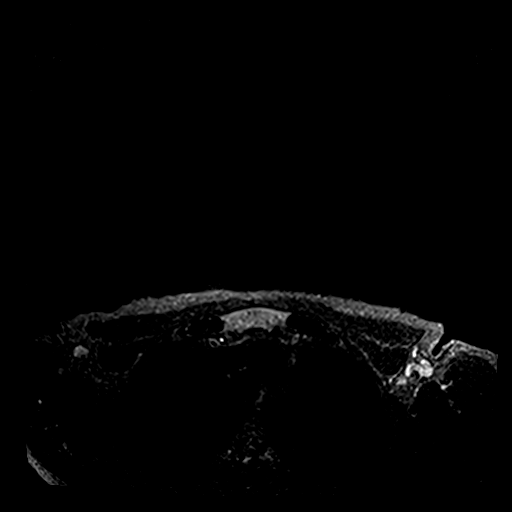

[Series 3: fl3d pre-cm no · axial · non-contrast · 1.2mm · 0.89mm/px · z∈[-104,+68]mm · 7 of 144 slices shown]
[im 1/144]
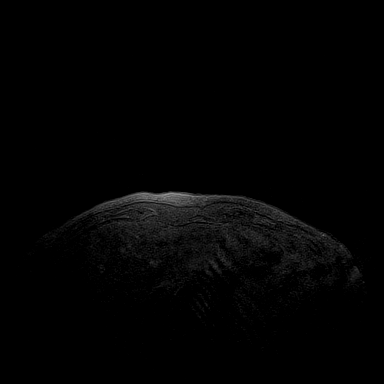
[im 24/144]
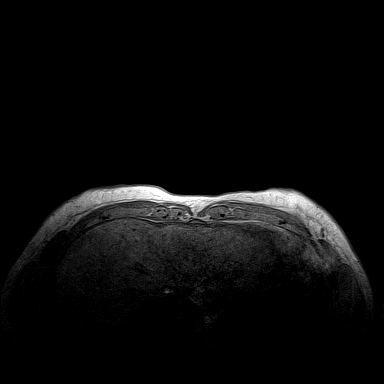
[im 48/144]
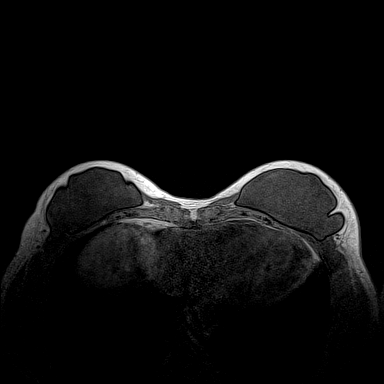
[im 72/144]
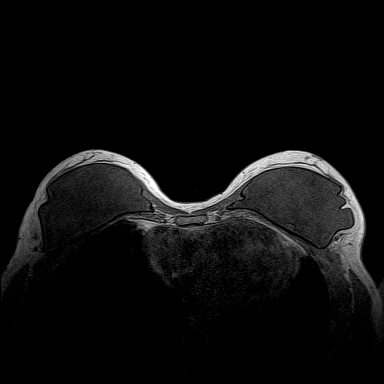
[im 96/144]
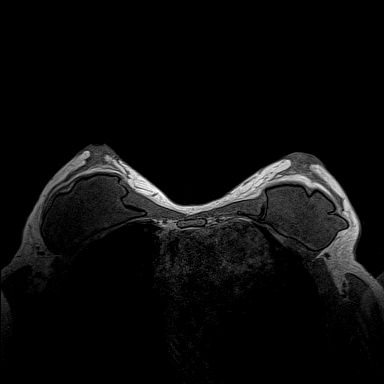
[im 120/144]
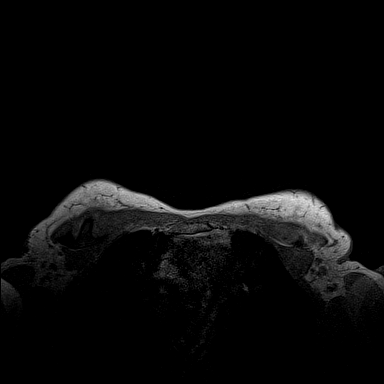
[im 144/144]
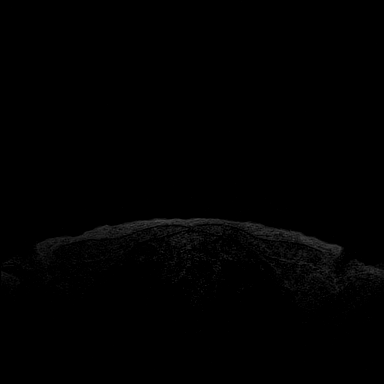

[Series 4: fl3d pre-cm · axial · non-contrast · 1.2mm · 0.89mm/px · z∈[-104,+68]mm · 7 of 144 slices shown]
[im 1/144]
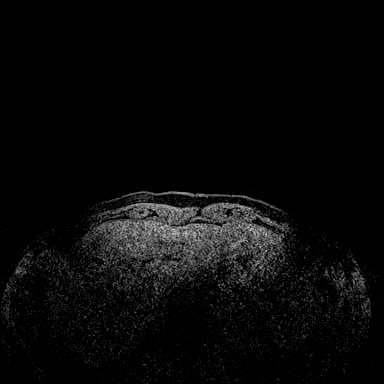
[im 24/144]
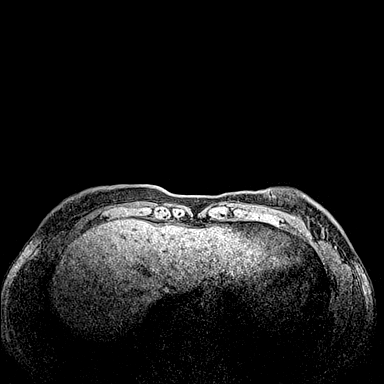
[im 48/144]
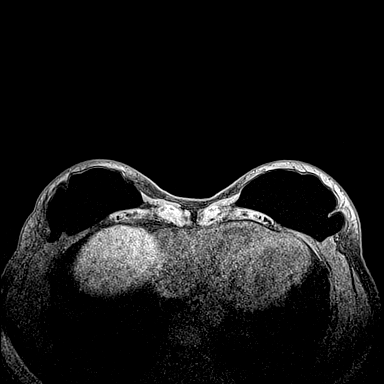
[im 72/144]
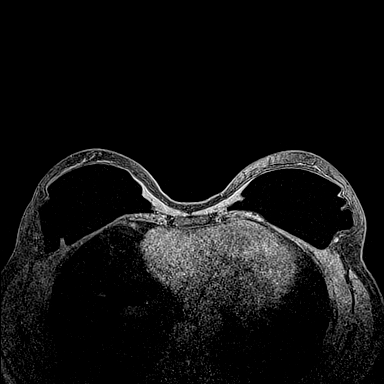
[im 96/144]
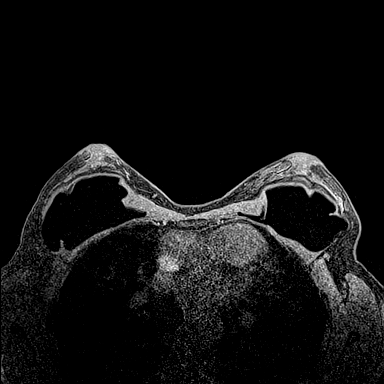
[im 120/144]
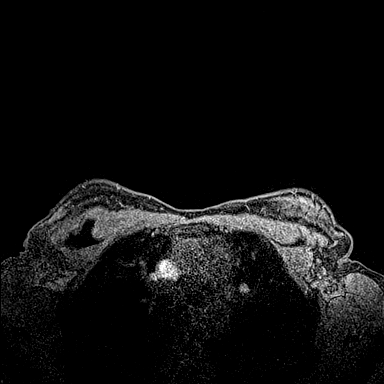
[im 144/144]
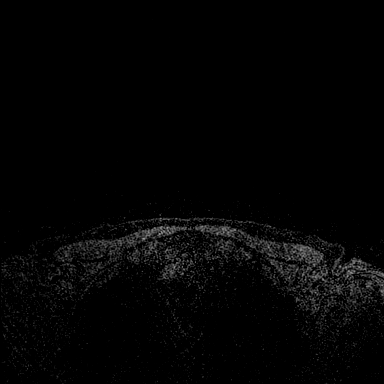

[Series 5: fl3d post-cm 20 · axial · 1.2mm · 0.89mm/px · z∈[-104,+68]mm · 7 of 144 slices shown (1 of 3)]
[im 1/144]
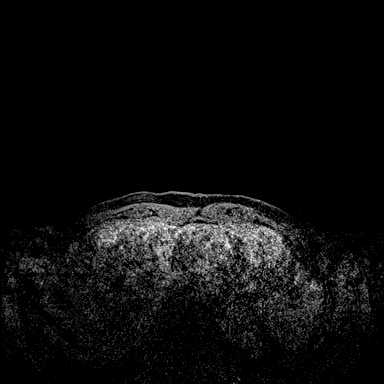
[im 24/144]
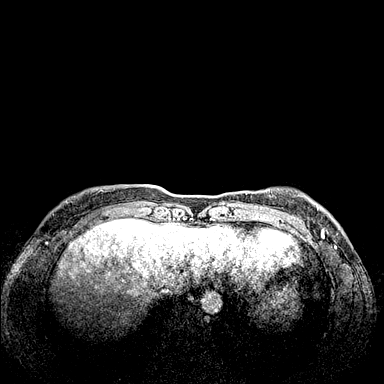
[im 48/144]
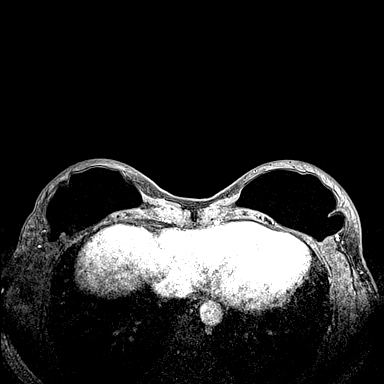
[im 72/144]
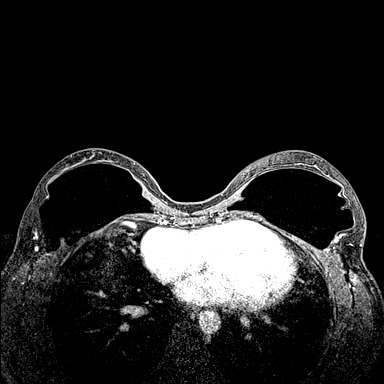
[im 96/144]
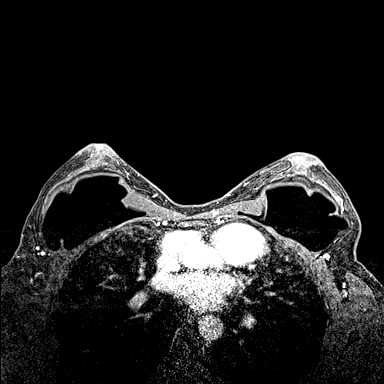
[im 120/144]
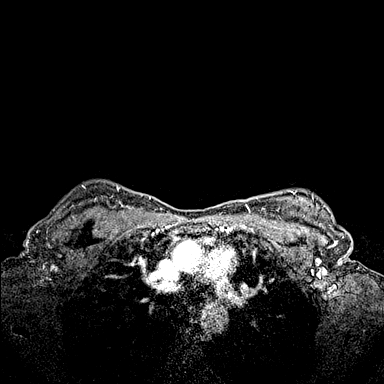
[im 144/144]
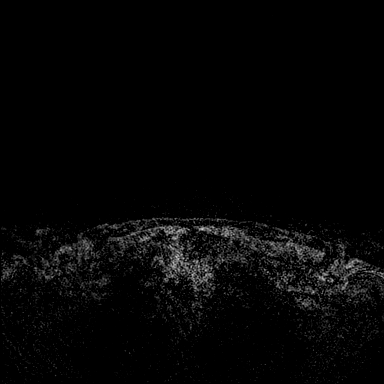

[Series 6: fl3d post-cm 20 · axial · 1.2mm · 0.89mm/px · z∈[-104,+68]mm · 7 of 144 slices shown (2 of 3)]
[im 1/144]
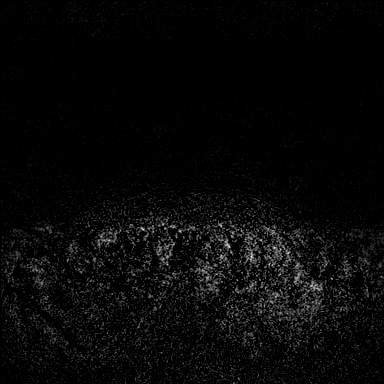
[im 24/144]
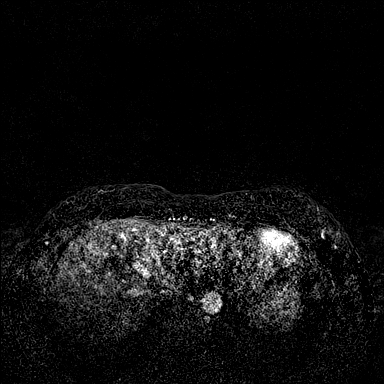
[im 48/144]
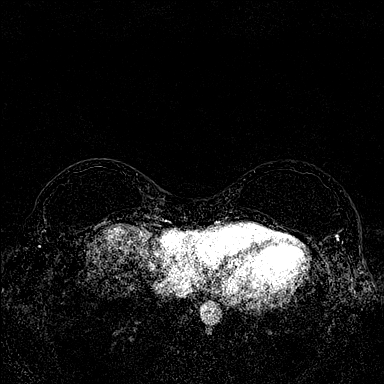
[im 72/144]
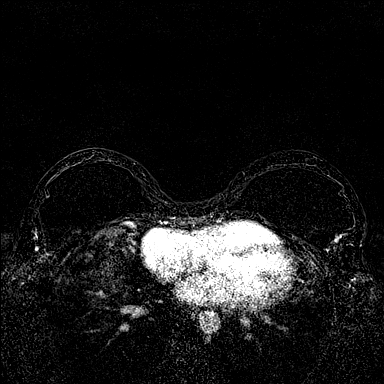
[im 96/144]
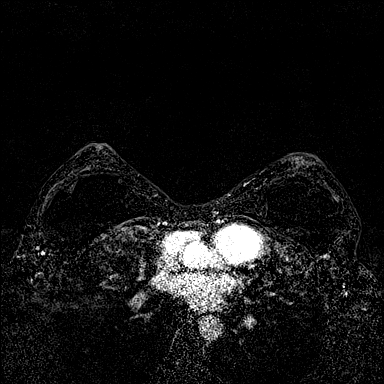
[im 120/144]
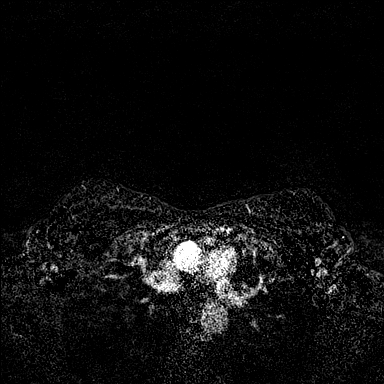
[im 144/144]
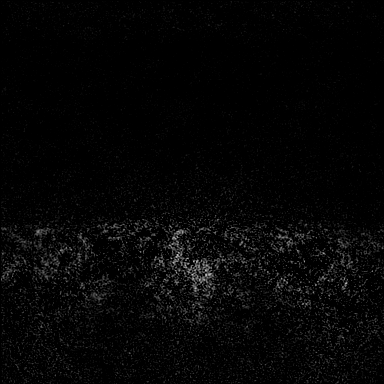

[Series 7: fl3d post-cm 20 · axial · 172.8mm · 0.89mm/px · 1 of 1 slices shown (3 of 3)]
[im 1/1]
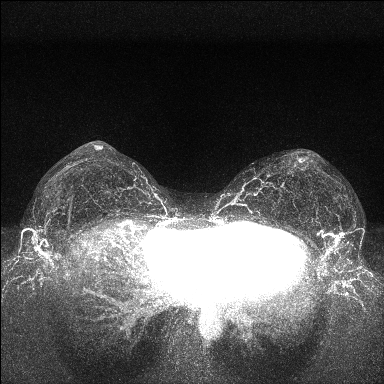

[Series 8: fl3d post-cm 3 · axial · 1.2mm · 0.89mm/px · 1 of 144 slices shown]
[im 1/144]
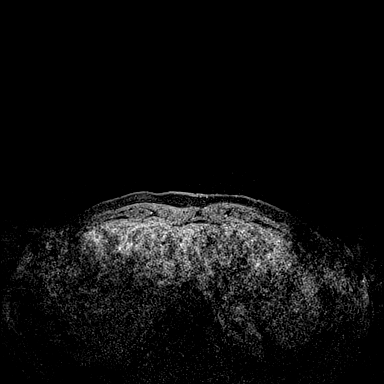

[32 of 48 positions shown; findings below may reference images not displayed]

Three-dimensional MR images were rendered by post-processing of the
original MR data on an independent workstation. The
three-dimensional MR images were interpreted, and findings are
reported in the following complete MRI report for this study. Three
dimensional images were evaluated at the independent interpreting
workstation using the DynaCAD thin client.
FINDINGS: Breast composition: c. Heterogeneous fibroglandular tissue.

Background parenchymal enhancement: Mild

Right breast: No suspicious enhancing mass, suspicious non-mass
enhancement or secondary signs of malignancy. Retropectoral silicone
implant in place.

Left breast: No suspicious enhancing mass, suspicious non-mass
enhancement or secondary signs of malignancy. Retropectoral silicone
implant in place.

Lymph nodes: No abnormal appearing lymph nodes.

Ancillary findings:  Stable small benign cysts in the liver.
IMPRESSION: No MRI evidence of malignancy within either breast.

RECOMMENDATION:
1. Annual screening mammograms.
2. Given patient's family history of breast cancer, would consider
the addition of annual screening breast MRI to annual screening
mammography. Per American Cancer Society guidelines, annual
screening MRI of the breasts is recommended if a risk assessment
calculation for breast cancer, preferably using the Tyrer-Cuzick or
Gail model, measures greater than 20% or for patients who are known
or suspected to be positive for the breast cancer gene.

BI-RADS CATEGORY  1: Negative.

## 2021-04-09 MED ORDER — GADOBUTROL 1 MMOL/ML IV SOLN
7.0000 mL | Freq: Once | INTRAVENOUS | Status: AC | PRN
  Administered 2021-04-09: 7 mL via INTRAVENOUS

## 2021-05-16 DIAGNOSIS — Z13220 Encounter for screening for lipoid disorders: Secondary | ICD-10-CM | POA: Diagnosis not present

## 2021-05-16 DIAGNOSIS — Z0000 Encounter for general adult medical examination without abnormal findings: Secondary | ICD-10-CM | POA: Diagnosis not present

## 2021-07-25 DIAGNOSIS — Z1231 Encounter for screening mammogram for malignant neoplasm of breast: Secondary | ICD-10-CM | POA: Diagnosis not present

## 2021-10-28 DIAGNOSIS — J0100 Acute maxillary sinusitis, unspecified: Secondary | ICD-10-CM | POA: Diagnosis not present

## 2021-10-28 DIAGNOSIS — R052 Subacute cough: Secondary | ICD-10-CM | POA: Diagnosis not present

## 2022-01-21 DIAGNOSIS — R87610 Atypical squamous cells of undetermined significance on cytologic smear of cervix (ASC-US): Secondary | ICD-10-CM | POA: Diagnosis not present

## 2022-01-21 DIAGNOSIS — Z114 Encounter for screening for human immunodeficiency virus [HIV]: Secondary | ICD-10-CM | POA: Diagnosis not present

## 2022-01-21 DIAGNOSIS — Z113 Encounter for screening for infections with a predominantly sexual mode of transmission: Secondary | ICD-10-CM | POA: Diagnosis not present

## 2022-01-21 DIAGNOSIS — Z1159 Encounter for screening for other viral diseases: Secondary | ICD-10-CM | POA: Diagnosis not present

## 2022-01-21 DIAGNOSIS — Z01419 Encounter for gynecological examination (general) (routine) without abnormal findings: Secondary | ICD-10-CM | POA: Diagnosis not present

## 2022-02-03 DIAGNOSIS — D892 Hypergammaglobulinemia, unspecified: Secondary | ICD-10-CM | POA: Diagnosis not present

## 2022-04-14 ENCOUNTER — Other Ambulatory Visit (HOSPITAL_COMMUNITY): Payer: Self-pay | Admitting: General Surgery

## 2022-04-14 DIAGNOSIS — Z1239 Encounter for other screening for malignant neoplasm of breast: Secondary | ICD-10-CM

## 2022-04-28 ENCOUNTER — Ambulatory Visit (HOSPITAL_COMMUNITY)
Admission: RE | Admit: 2022-04-28 | Discharge: 2022-04-28 | Disposition: A | Discharge status: Home / Self Care | Payer: Federal, State, Local not specified - PPO | Source: Ambulatory Visit | Attending: General Surgery | Admitting: General Surgery

## 2022-04-28 DIAGNOSIS — Z139 Encounter for screening, unspecified: Secondary | ICD-10-CM | POA: Diagnosis not present

## 2022-04-28 DIAGNOSIS — R922 Inconclusive mammogram: Secondary | ICD-10-CM | POA: Diagnosis not present

## 2022-04-28 DIAGNOSIS — Z1239 Encounter for other screening for malignant neoplasm of breast: Secondary | ICD-10-CM | POA: Diagnosis not present

## 2022-04-28 MED ORDER — GADOBUTROL 1 MMOL/ML IV SOLN
6.5000 mL | Freq: Once | INTRAVENOUS | Status: AC | PRN
  Administered 2022-04-28: 6.5 mL via INTRAVENOUS

## 2022-06-03 DIAGNOSIS — Z803 Family history of malignant neoplasm of breast: Secondary | ICD-10-CM | POA: Diagnosis not present

## 2022-09-09 DIAGNOSIS — Z13220 Encounter for screening for lipoid disorders: Secondary | ICD-10-CM | POA: Diagnosis not present

## 2022-09-09 DIAGNOSIS — Z9189 Other specified personal risk factors, not elsewhere classified: Secondary | ICD-10-CM | POA: Diagnosis not present

## 2022-09-09 DIAGNOSIS — Z0000 Encounter for general adult medical examination without abnormal findings: Secondary | ICD-10-CM | POA: Diagnosis not present

## 2022-10-21 DIAGNOSIS — Z1231 Encounter for screening mammogram for malignant neoplasm of breast: Secondary | ICD-10-CM | POA: Diagnosis not present

## 2023-01-27 DIAGNOSIS — Z01419 Encounter for gynecological examination (general) (routine) without abnormal findings: Secondary | ICD-10-CM | POA: Diagnosis not present

## 2023-03-24 ENCOUNTER — Other Ambulatory Visit: Payer: Self-pay | Admitting: General Surgery

## 2023-03-24 DIAGNOSIS — Z1239 Encounter for other screening for malignant neoplasm of breast: Secondary | ICD-10-CM

## 2023-03-24 DIAGNOSIS — Z803 Family history of malignant neoplasm of breast: Secondary | ICD-10-CM

## 2023-03-29 ENCOUNTER — Ambulatory Visit: Payer: Federal, State, Local not specified - PPO | Admitting: Podiatry

## 2023-03-29 ENCOUNTER — Encounter: Payer: Self-pay | Admitting: Podiatry

## 2023-03-29 ENCOUNTER — Ambulatory Visit (INDEPENDENT_AMBULATORY_CARE_PROVIDER_SITE_OTHER): Payer: Federal, State, Local not specified - PPO

## 2023-03-29 DIAGNOSIS — G5763 Lesion of plantar nerve, bilateral lower limbs: Secondary | ICD-10-CM | POA: Diagnosis not present

## 2023-03-29 DIAGNOSIS — M779 Enthesopathy, unspecified: Secondary | ICD-10-CM | POA: Diagnosis not present

## 2023-03-29 NOTE — Progress Notes (Signed)
Chief Complaint  Patient presents with   Foot Pain    PATIENT STATES SHE HAS A LOT OF CRAMPING IN HER BILATERAL FEET MAINLY AT NIGHT,AND ALSO IN HER R FOOT BEHIND AND AROUND HER R HALLUX AND SOMETIME HER TOES, PATIENT STATES THAT IT HAS BEEN 6 MONTH AND LONGER THAT HER FEET HAVE BEEN FEELING LIKE THIS . NO MEDICATION FOR PAIN OTHER THEN DRINKING PICKLE JUICE FOR CRAMPING .     HPI: 52 y.o. female presenting today for evaluation of pain and tenderness associated to the bilateral forefoot onset several years.  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  The severity of her pain at night depends on the shoes that she wears.  She drinks pickle juice in the evenings to help alleviate some of her symptoms  Past Medical History:  Diagnosis Date   Breast mass, right    Multiple allergies    Wears glasses    for driving    Past Surgical History:  Procedure Laterality Date   CESAREAN SECTION     COSMETIC SURGERY     left lower leg-scar   INTRAUTERINE DEVICE INSERTION     PLACEMENT OF BREAST IMPLANTS  2013   RADIOACTIVE SEED GUIDED EXCISIONAL BREAST BIOPSY Left 04/24/2014   Procedure: RADIOACTIVE SEED GUIDED EXCISIONAL BREAST BIOPSY;  Surgeon: Emelia Loron, MD;  Location: [facility];  Service: General;  Laterality: Left;   RADIOACTIVE SEED GUIDED EXCISIONAL BREAST BIOPSY Right 11/24/2017   Procedure: RIGHT BREAST RADIOACTIVE SEED GUIDED EXCISIONAL BIOPSY;  Surgeon: Emelia Loron, MD;  Location: [facility];  Service: General;  Laterality: Right;    Allergies  Allergen Reactions   Banana Nausea And Vomiting   Mango Flavor Nausea And Vomiting   Other Nausea And Vomiting    Any melons     Physical Exam: General: The patient is alert and oriented x3 in no acute distress.  Dermatology: Skin is warm, dry and supple bilateral lower extremities.   Vascular: Palpable pedal pulses bilaterally. Capillary refill within normal  limits.  No appreciable edema.  No erythema.  Neurological: Grossly intact via light touch  Musculoskeletal Exam: No pedal deformities noted  Radiographic Exam B/L feet 03/29/2023:  Normal osseous mineralization. Joint spaces preserved.  No fractures or osseous irregularities noted.  Impression: Negative  Assessment/Plan of Care: 1.  Nocturnal foot cramps secondary to shoe gear  -Patient evaluated.  X-rays reviewed -The majority the patient's symptoms are secondary to shoe gear.  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  What type of shoes she wears on a daily basis correlates directly with the exacerbation of nocturnal cramping and pain to the forefoot. -Recommend good supportive shoes with a small heel lift so she does not put as much pressure on the forefoot.  Also recommend wider fitting shoes -Recommend foot massage nightly -Return to clinic as needed  *Divorce Attorney. Friends with the [employer].        Felecia Shelling, DPM [facility]  Dr. Felecia Shelling, DPM    [address], [place] [address]                Office [phone])  [phone]  Fax [phone]

## 2023-04-14 DIAGNOSIS — E559 Vitamin D deficiency, unspecified: Secondary | ICD-10-CM | POA: Diagnosis not present

## 2023-04-14 DIAGNOSIS — L309 Dermatitis, unspecified: Secondary | ICD-10-CM | POA: Diagnosis not present

## 2023-04-14 DIAGNOSIS — J309 Allergic rhinitis, unspecified: Secondary | ICD-10-CM | POA: Diagnosis not present

## 2023-04-14 DIAGNOSIS — M25512 Pain in left shoulder: Secondary | ICD-10-CM | POA: Diagnosis not present

## 2023-05-09 ENCOUNTER — Ambulatory Visit
Admission: RE | Admit: 2023-05-09 | Discharge: 2023-05-09 | Disposition: A | Discharge status: Home / Self Care | Payer: Federal, State, Local not specified - PPO | Source: Ambulatory Visit | Attending: General Surgery | Admitting: General Surgery

## 2023-05-09 DIAGNOSIS — Z1239 Encounter for other screening for malignant neoplasm of breast: Secondary | ICD-10-CM | POA: Diagnosis not present

## 2023-05-09 DIAGNOSIS — Z803 Family history of malignant neoplasm of breast: Secondary | ICD-10-CM

## 2023-05-09 MED ORDER — GADOPICLENOL 0.5 MMOL/ML IV SOLN
6.0000 mL | Freq: Once | INTRAVENOUS | Status: AC | PRN
  Administered 2023-05-09: 6 mL via INTRAVENOUS

## 2023-06-15 DIAGNOSIS — Z803 Family history of malignant neoplasm of breast: Secondary | ICD-10-CM | POA: Diagnosis not present

## 2023-06-15 DIAGNOSIS — Z1239 Encounter for other screening for malignant neoplasm of breast: Secondary | ICD-10-CM | POA: Diagnosis not present

## 2023-09-30 DIAGNOSIS — Z0000 Encounter for general adult medical examination without abnormal findings: Secondary | ICD-10-CM | POA: Diagnosis not present

## 2023-09-30 DIAGNOSIS — Z13220 Encounter for screening for lipoid disorders: Secondary | ICD-10-CM | POA: Diagnosis not present

## 2023-09-30 DIAGNOSIS — Z23 Encounter for immunization: Secondary | ICD-10-CM | POA: Diagnosis not present

## 2023-11-03 DIAGNOSIS — R799 Abnormal finding of blood chemistry, unspecified: Secondary | ICD-10-CM | POA: Diagnosis not present

## 2023-11-10 DIAGNOSIS — Z1239 Encounter for other screening for malignant neoplasm of breast: Secondary | ICD-10-CM | POA: Diagnosis not present

## 2023-11-10 DIAGNOSIS — R92333 Mammographic heterogeneous density, bilateral breasts: Secondary | ICD-10-CM | POA: Diagnosis not present

## 2024-03-09 DIAGNOSIS — Z01419 Encounter for gynecological examination (general) (routine) without abnormal findings: Secondary | ICD-10-CM | POA: Diagnosis not present

## 2024-03-09 DIAGNOSIS — R232 Flushing: Secondary | ICD-10-CM | POA: Diagnosis not present

## 2024-03-09 DIAGNOSIS — Z124 Encounter for screening for malignant neoplasm of cervix: Secondary | ICD-10-CM | POA: Diagnosis not present

## 2024-03-09 DIAGNOSIS — N926 Irregular menstruation, unspecified: Secondary | ICD-10-CM | POA: Diagnosis not present

## 2024-04-24 ENCOUNTER — Other Ambulatory Visit (HOSPITAL_COMMUNITY): Payer: Self-pay | Admitting: Internal Medicine

## 2024-04-24 DIAGNOSIS — E7800 Pure hypercholesterolemia, unspecified: Secondary | ICD-10-CM

## 2024-05-03 ENCOUNTER — Ambulatory Visit (HOSPITAL_BASED_OUTPATIENT_CLINIC_OR_DEPARTMENT_OTHER)
Admission: RE | Admit: 2024-05-03 | Discharge: 2024-05-03 | Disposition: A | Discharge status: Home / Self Care | Payer: Self-pay | Source: Ambulatory Visit | Attending: Internal Medicine | Admitting: Internal Medicine

## 2024-05-03 DIAGNOSIS — E7800 Pure hypercholesterolemia, unspecified: Secondary | ICD-10-CM | POA: Insufficient documentation
